# Patient Record
Sex: Female | Born: 1990 | Hispanic: No | Marital: Married | Smoking: Never smoker
Health system: Southern US, Community
[De-identification: ages and names within clinical notes are randomized; demographics above are authoritative.]

## PROBLEM LIST (undated history)

## (undated) ENCOUNTER — Inpatient Hospital Stay (HOSPITAL_COMMUNITY): Payer: Self-pay

## (undated) DIAGNOSIS — Z789 Other specified health status: Secondary | ICD-10-CM

## (undated) DIAGNOSIS — J45909 Unspecified asthma, uncomplicated: Secondary | ICD-10-CM

## (undated) HISTORY — DX: Unspecified asthma, uncomplicated: J45.909

## (undated) HISTORY — PX: WISDOM TOOTH EXTRACTION: SHX21

## (undated) HISTORY — PX: NO PAST SURGERIES: SHX2092

---
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
## 2009-02-23 IMAGING — US US ABDOMEN COMPLETE
2 series · 14 of 25 positions shown · non-contrast
Comparison: None

CLINICAL DATA: Right upper quadrant pain.

ABDOMEN ULTRASOUND
TECHNIQUE: Complete abdominal ultrasound examination was performed
including evaluation of the liver, gallbladder, bile ducts,
pancreas, kidneys, spleen, IVC, and abdominal aorta.

[Series 1: unknown · 0.22mm/px · 2 of 13 slices shown (1 of 2)]
[im 1/13]
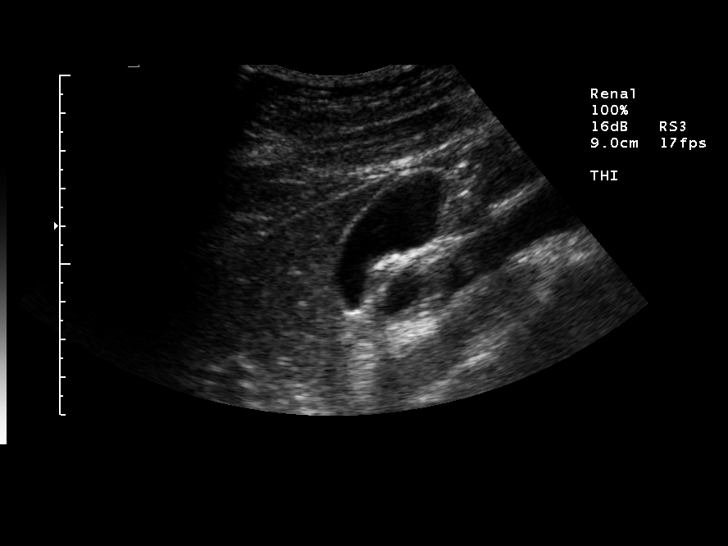
[im 9/13]
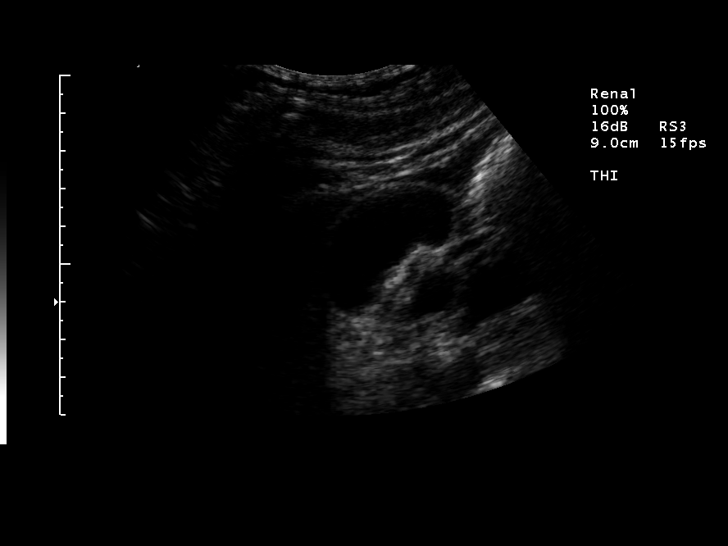

[Series 1: unknown · 0.30mm/px · 12 of 64 slices shown (2 of 2)]
[im 1/64]
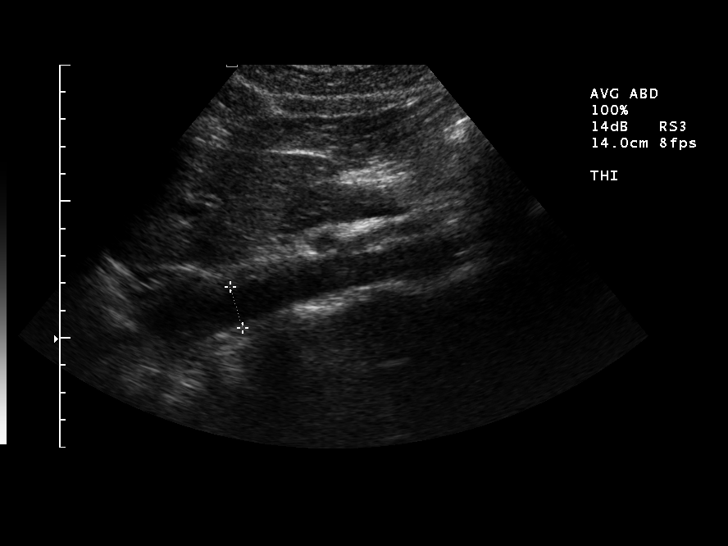
[im 7/64]
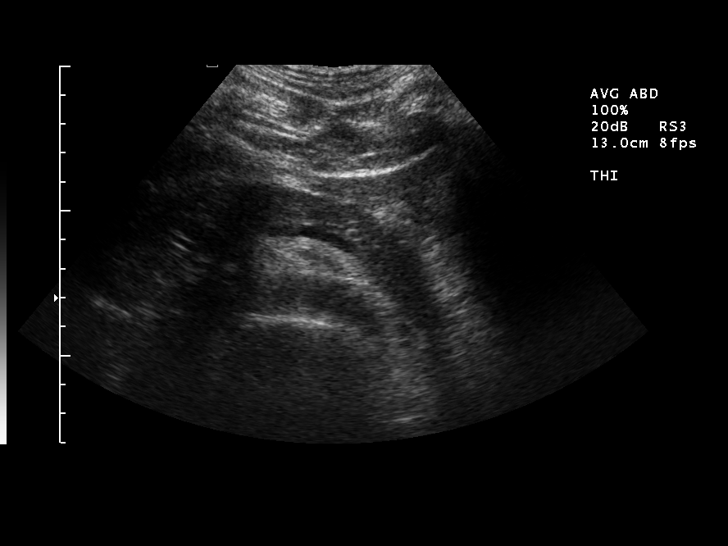
[im 13/64]
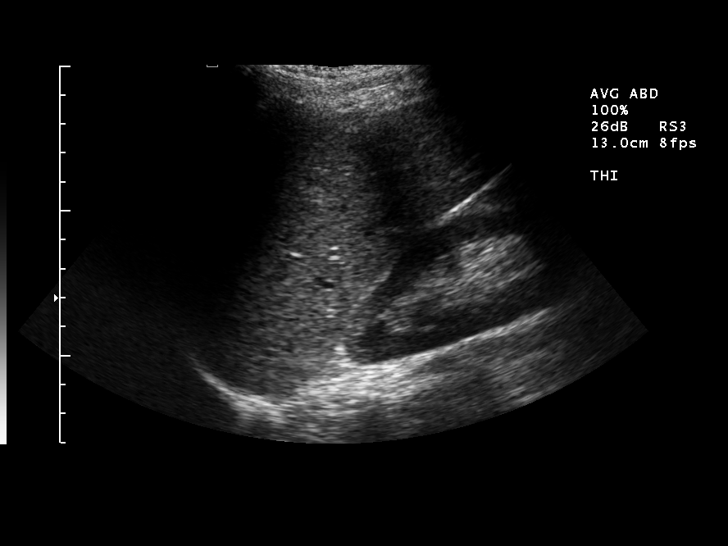
[im 16/64]
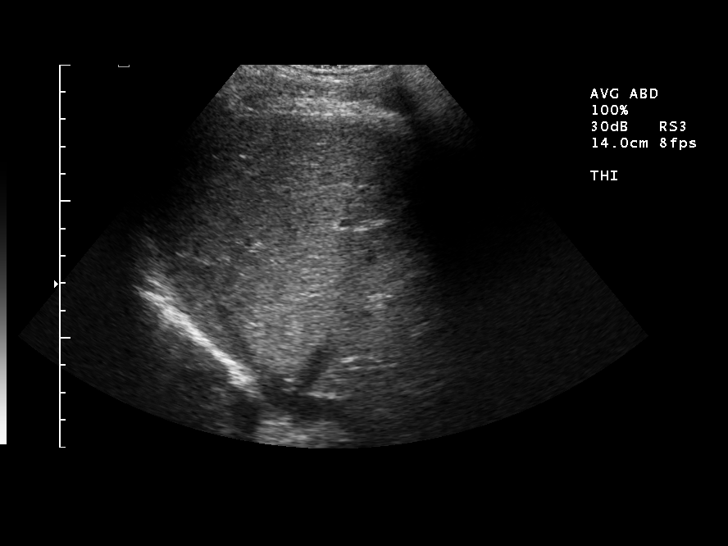
[im 23/64]
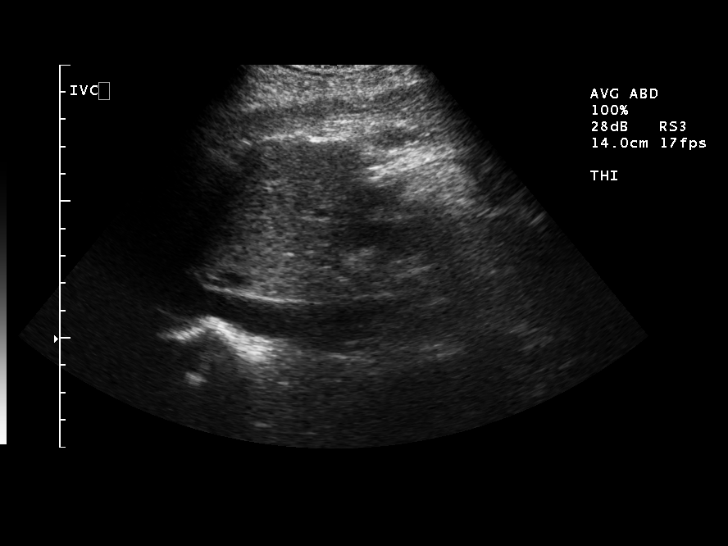
[im 29/64]
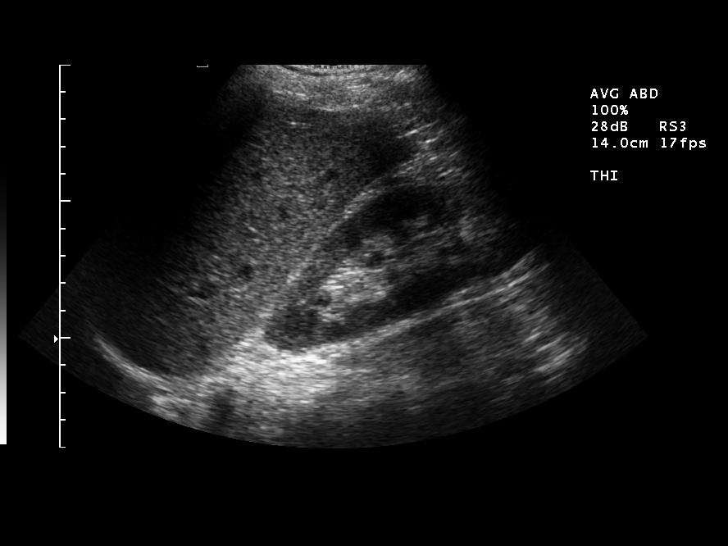
[im 35/64]
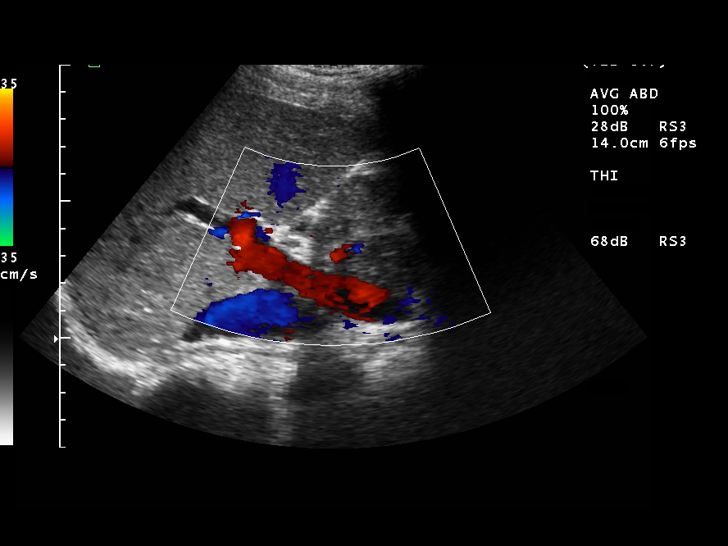
[im 38/64]
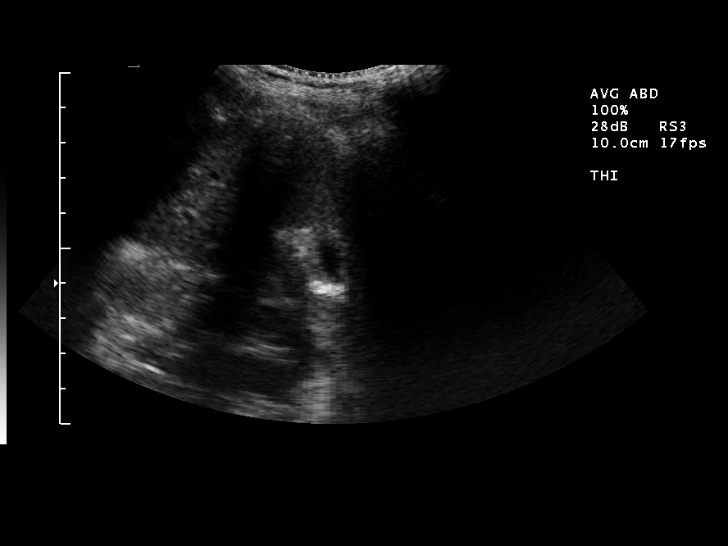
[im 45/64]
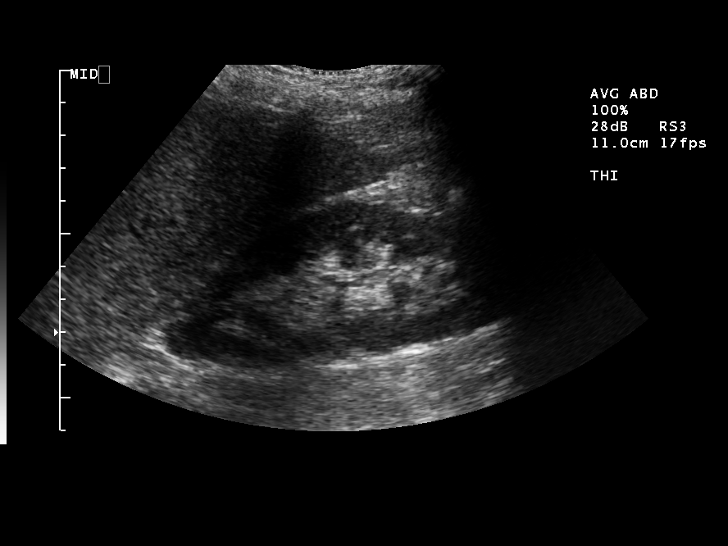
[im 51/64]
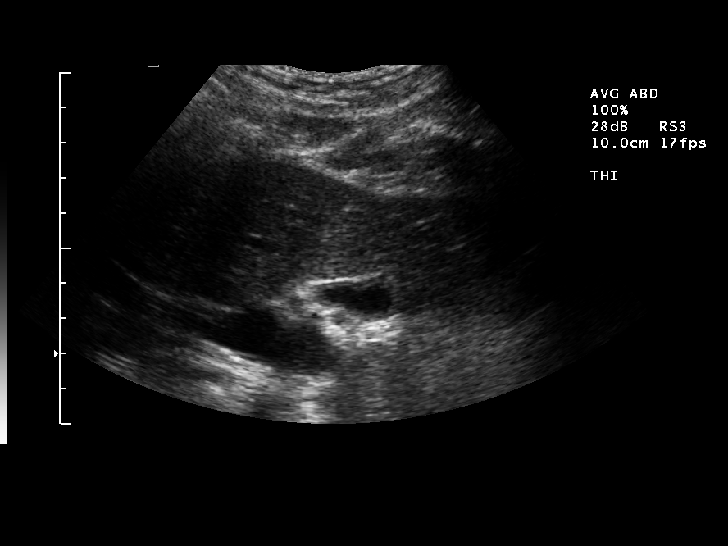
[im 57/64]
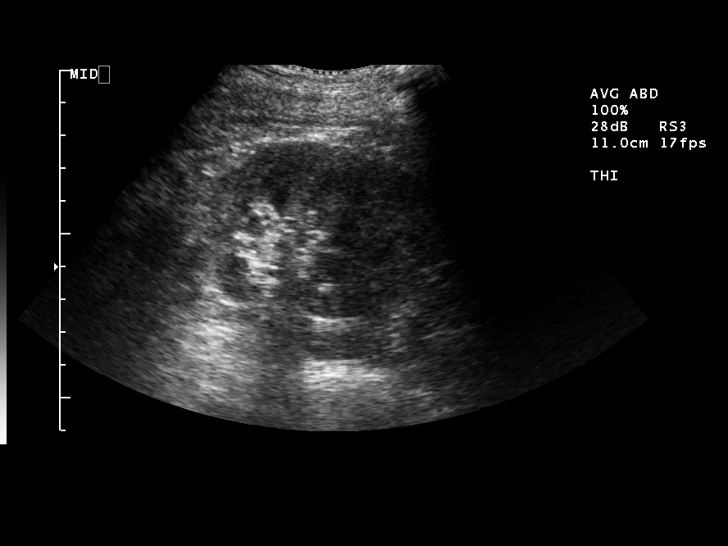
[im 64/64]
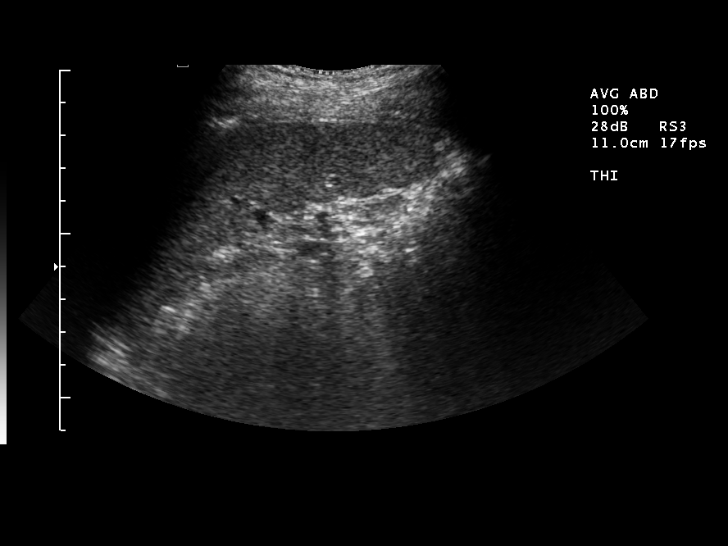

[14 of 25 positions shown; findings below may reference images not displayed]

FINDINGS: Liver, gallbladder, extrahepatic bile duct, IVC,
pancreas, spleen, kidneys and aorta are unremarkable.
IMPRESSION: No acute findings.

## 2009-06-16 ENCOUNTER — Other Ambulatory Visit: Admission: RE | Admit: 2009-06-16 | Discharge: 2009-06-16 | Payer: Self-pay | Admitting: Obstetrics and Gynecology

## 2010-12-18 LAB — DIFFERENTIAL
Basophils Absolute: 0 10*3/uL (ref 0.0–0.1)
Eosinophils Relative: 1 % (ref 0–5)
Lymphocytes Relative: 46 % (ref 12–46)
Lymphs Abs: 2.9 10*3/uL (ref 0.7–4.0)
Monocytes Absolute: 0.5 10*3/uL (ref 0.1–1.0)
Neutro Abs: 2.8 10*3/uL (ref 1.7–7.7)
Neutrophils Relative %: 44 % (ref 43–77)

## 2010-12-18 LAB — CBC
HCT: 40 % (ref 36.0–46.0)
MCHC: 33.6 g/dL (ref 30.0–36.0)
Platelets: 259 10*3/uL (ref 150–400)
WBC: 6.3 10*3/uL (ref 4.0–10.5)

## 2015-12-02 LAB — OB RESULTS CONSOLE ANTIBODY SCREEN: ANTIBODY SCREEN: NEGATIVE

## 2015-12-02 LAB — OB RESULTS CONSOLE ABO/RH: RH TYPE: POSITIVE

## 2015-12-02 LAB — OB RESULTS CONSOLE HIV ANTIBODY (ROUTINE TESTING): HIV: NONREACTIVE

## 2015-12-02 LAB — OB RESULTS CONSOLE HEPATITIS B SURFACE ANTIGEN: HEP B S AG: NEGATIVE

## 2015-12-02 LAB — OB RESULTS CONSOLE RUBELLA ANTIBODY, IGM: RUBELLA: IMMUNE

## 2016-03-15 NOTE — L&D Delivery Note (Addendum)
Delivery Note At 8:03 AM a viable female was delivered via Vaginal, Spontaneous Delivery (Presentation: ROA ;  ).  APGAR: 9, 9; weight  .   Placenta status: L&D, .  Cord: 3VC  with the following complications: none.  Cord pH: sent  Anesthesia:  CLE Episiotomy:  None Lacerations:  2nd degree perineal, L peri-urethral Suture Repair: 2.0 3.0 vicryl Est. Blood Loss (mL):  200cc  Mom to postpartum.  Baby to Couplet care / Skin to Skin. It's a boy - "Stacey Ryan"!  [name] 07/07/2016, 8:45 AM

## 2016-06-02 ENCOUNTER — Inpatient Hospital Stay (HOSPITAL_COMMUNITY)
Admission: AD | Admit: 2016-06-02 | Discharge: 2016-06-02 | Disposition: A | Discharge status: Home / Self Care | Payer: BLUE CROSS/BLUE SHIELD | Source: Ambulatory Visit | Attending: Obstetrics and Gynecology | Admitting: Obstetrics and Gynecology

## 2016-06-02 ENCOUNTER — Encounter (HOSPITAL_COMMUNITY): Payer: Self-pay | Admitting: *Deleted

## 2016-06-02 DIAGNOSIS — N898 Other specified noninflammatory disorders of vagina: Secondary | ICD-10-CM | POA: Diagnosis not present

## 2016-06-02 DIAGNOSIS — Z3A34 34 weeks gestation of pregnancy: Secondary | ICD-10-CM | POA: Diagnosis not present

## 2016-06-02 DIAGNOSIS — Z0371 Encounter for suspected problem with amniotic cavity and membrane ruled out: Secondary | ICD-10-CM

## 2016-06-02 DIAGNOSIS — Z79899 Other long term (current) drug therapy: Secondary | ICD-10-CM | POA: Insufficient documentation

## 2016-06-02 DIAGNOSIS — R102 Pelvic and perineal pain: Secondary | ICD-10-CM | POA: Insufficient documentation

## 2016-06-02 DIAGNOSIS — O9989 Other specified diseases and conditions complicating pregnancy, childbirth and the puerperium: Secondary | ICD-10-CM

## 2016-06-02 DIAGNOSIS — O26893 Other specified pregnancy related conditions, third trimester: Secondary | ICD-10-CM | POA: Diagnosis not present

## 2016-06-02 HISTORY — DX: Other specified health status: Z78.9

## 2016-06-02 LAB — URINALYSIS, ROUTINE W REFLEX MICROSCOPIC
Bilirubin Urine: NEGATIVE
Glucose, UA: NEGATIVE mg/dL
Hgb urine dipstick: NEGATIVE
Ketones, ur: NEGATIVE mg/dL
LEUKOCYTES UA: NEGATIVE
NITRITE: NEGATIVE
PH: 6 (ref 5.0–8.0)
Protein, ur: NEGATIVE mg/dL
SPECIFIC GRAVITY, URINE: 1.011 (ref 1.005–1.030)

## 2016-06-02 LAB — AMNISURE RUPTURE OF MEMBRANE (ROM) NOT AT ARMC: AMNISURE: NEGATIVE

## 2016-06-02 NOTE — MAU Provider Note (Signed)
History     CSN: [id number]  Arrival date and time: 06/02/16 1818   First Provider Initiated Contact with Patient 06/02/16 1927      Chief Complaint  Patient presents with  . Rupture of Membranes  . Pelvic Pain   HPI   [name] is a 26 y.o. female G1P0 @ 6557w4d here in MAU with leaking of fluid. The leaking started 3 days ago. The fluid is clear. She was advised by her Dr. To come into MAU.   OB History    Gravida Para Term Preterm AB Living   1             SAB TAB Ectopic Multiple Live Births                  Past Medical History:  Diagnosis Date  . Medical history non-contributory     Past Surgical History:  Procedure Laterality Date  . WISDOM TOOTH EXTRACTION      No family history on file.  Social History  Substance Use Topics  . Smoking status: Never Smoker  . Smokeless tobacco: Never Used  . Alcohol use No    Allergies: No Known Allergies  Prescriptions Prior to Admission  Medication Sig Dispense Refill Last Dose  . albuterol (PROVENTIL HFA;VENTOLIN HFA) 108 (90 Base) MCG/ACT inhaler Inhale 2 puffs into the lungs every 6 (six) hours as needed.    Rescue  . ferrous sulfate 325 (65 FE) MG tablet Take 325 mg by mouth daily with breakfast.   06/02/2016 at Unknown time  . omeprazole (PRILOSEC) 20 MG capsule Take 20 mg by mouth daily.   Past Week at Unknown time  . Prenatal Vit-Fe Fumarate-FA (PRENATAL MULTIVITAMIN) TABS tablet Take 1 tablet by mouth daily at 12 noon.   06/02/2016 at Unknown time   Results for orders placed or performed during the hospital encounter of 06/02/16 (from the past 48 hour(s))  Urinalysis, Routine w reflex microscopic     Status: None   Collection Time: 06/02/16  6:42 PM  Result Value Ref Range   Color, Urine YELLOW YELLOW   APPearance CLEAR CLEAR   Specific Gravity, Urine 1.011 1.005 - 1.030   pH 6.0 5.0 - 8.0   Glucose, UA NEGATIVE NEGATIVE mg/dL   Hgb urine dipstick NEGATIVE NEGATIVE   Bilirubin Urine NEGATIVE  NEGATIVE   Ketones, ur NEGATIVE NEGATIVE mg/dL   Protein, ur NEGATIVE NEGATIVE mg/dL   Nitrite NEGATIVE NEGATIVE   Leukocytes, UA NEGATIVE NEGATIVE  [facility]     Status: None   Collection Time: 06/02/16  7:40 PM  Result Value Ref Range   Amnisure ROM NEGATIVE    Review of Systems  Constitutional: Negative for fever.  Genitourinary: Positive for pelvic pain and vaginal discharge.   Physical Exam   Blood pressure 120/74, pulse 88, temperature 98.2 F (36.8 C), temperature source Oral, resp. rate 16.  Physical Exam  Constitutional: She is oriented to person, place, and time. She appears well-developed and well-nourished. No distress.  HENT:  Head: Normocephalic.  Eyes: Pupils are equal, round, and reactive to light.  Respiratory: Effort normal.  GI: Soft. She exhibits no distension. There is no tenderness. There is no rebound and no guarding.  Genitourinary:  Genitourinary Comments: Vagina - Small amount of white, mucus like vaginal discharge, no odor, no pooling of fluid  Cervix - appears closed, no contact bleeding, no active bleeding  Bimanual exam: Cervix : closed, thick, posterior  Amnisure collected  Chaperone present for exam.   Musculoskeletal: Normal range of motion.  Neurological: She is alert and oriented to person, place, and time.  Skin: Skin is warm. She is not diaphoretic.  Psychiatric: Her behavior is normal.   Fetal Tracing: Baseline: 145 bpm Variability: Moderate  Accelerations: 15x15 Decelerations: None Toco: occasional UI   MAU Course  Procedures  None  MDM  Fern slide negative amnisure negative  Discussed patient with Dr. Rana Snare ok to DC home.   Assessment and Plan   A:  1. Encounter for suspected premature rupture of amniotic membranes, with rupture of membranes not found   2. Pelvic pain affecting pregnancy in third trimester, antepartum     P:  Discharge home in stable condition Return to MAU If  symptoms worsen Preterm labor precautions Follow up with Dr. Lianne Moris I Rasch, NP 06/02/2016 8:24 PM

## 2016-06-02 NOTE — Discharge Instructions (Signed)
Third Trimester of Pregnancy The third trimester is from week 28 through week 40 (months 7 through 9). The third trimester is a time when the unborn baby (fetus) is growing rapidly. At the end of the ninth month, the fetus is about 20 inches in length and weighs 6-10 pounds. Body changes during your third trimester Your body will continue to go through many changes during pregnancy. The changes vary from woman to woman. During the third trimester:  Your weight will continue to increase. You can expect to gain 25-35 pounds (11-16 kg) by the end of the pregnancy.  You may begin to get stretch marks on your hips, abdomen, and breasts.  You may urinate more often because the fetus is moving lower into your pelvis and pressing on your bladder.  You may develop or continue to have heartburn. This is caused by increased hormones that slow down muscles in the digestive tract.  You may develop or continue to have constipation because increased hormones slow digestion and cause the muscles that push waste through your intestines to relax.  You may develop hemorrhoids. These are swollen veins (varicose veins) in the rectum that can itch or be painful.  You may develop swollen, bulging veins (varicose veins) in your legs.  You may have increased body aches in the pelvis, back, or thighs. This is due to weight gain and increased hormones that are relaxing your joints.  You may have changes in your hair. These can include thickening of your hair, rapid growth, and changes in texture. Some women also have hair loss during or after pregnancy, or hair that feels dry or thin. Your hair will most likely return to normal after your baby is born.  Your breasts will continue to grow and they will continue to become tender. A yellow fluid (colostrum) may leak from your breasts. This is the first milk you are producing for your baby.  Your belly button may stick out.  You may notice more swelling in your hands,  face, or ankles.  You may have increased tingling or numbness in your hands, arms, and legs. The skin on your belly may also feel numb.  You may feel short of breath because of your expanding uterus.  You may have more problems sleeping. This can be caused by the size of your belly, increased need to urinate, and an increase in your body's metabolism.  You may notice the fetus "dropping," or moving lower in your abdomen (lightening).  You may have increased vaginal discharge.  You may notice your joints feel loose and you may have pain around your pelvic bone.  What to expect at prenatal visits You will have prenatal exams every 2 weeks until week 36. Then you will have weekly prenatal exams. During a routine prenatal visit:  You will be weighed to make sure you and the baby are growing normally.  Your blood pressure will be taken.  Your abdomen will be measured to track your baby's growth.  The fetal heartbeat will be listened to.  Any test results from the previous visit will be discussed.  You may have a cervical check near your due date to see if your cervix has softened or thinned (effaced).  You will be tested for Group B streptococcus. This happens between 35 and 37 weeks.  Your health care provider may ask you:  What your birth plan is.  How you are feeling.  If you are feeling the baby move.  If you have had   any abnormal symptoms, such as leaking fluid, bleeding, severe headaches, or abdominal cramping.  If you are using any tobacco products, including cigarettes, chewing tobacco, and electronic cigarettes.  If you have any questions.  Other tests or screenings that may be performed during your third trimester include:  Blood tests that check for low iron levels (anemia).  Fetal testing to check the health, activity level, and growth of the fetus. Testing is done if you have certain medical conditions or if there are problems during the  pregnancy.  Nonstress test (NST). This test checks the health of your baby to make sure there are no signs of problems, such as the baby not getting enough oxygen. During this test, a belt is placed around your belly. The baby is made to move, and its heart rate is monitored during movement.  What is false labor? False labor is a condition in which you feel small, irregular tightenings of the muscles in the womb (contractions) that usually go away with rest, changing position, or drinking water. These are called Braxton Hicks contractions. Contractions may last for hours, days, or even weeks before true labor sets in. If contractions come at regular intervals, become more frequent, increase in intensity, or become painful, you should see your health care provider. What are the signs of labor?  Abdominal cramps.  Regular contractions that start at 10 minutes apart and become stronger and more frequent with time.  Contractions that start on the top of the uterus and spread down to the lower abdomen and back.  Increased pelvic pressure and dull back pain.  A watery or bloody mucus discharge that comes from the vagina.  Leaking of amniotic fluid. This is also known as your "water breaking." It could be a slow trickle or a gush. Let your health care provider know if it has a color or strange odor. If you have any of these signs, call your health care provider right away, even if it is before your due date. Follow these instructions at home: Medicines  Follow your health care provider's instructions regarding medicine use. Specific medicines may be either safe or unsafe to take during pregnancy.  Take a prenatal vitamin that contains at least 600 micrograms (mcg) of folic acid.  If you develop constipation, try taking a stool softener if your health care provider approves. Eating and drinking  Eat a balanced diet that includes fresh fruits and vegetables, whole grains, good sources of protein  such as meat, eggs, or tofu, and low-fat dairy. Your health care provider will help you determine the amount of weight gain that is right for you.  Avoid raw meat and uncooked cheese. These carry germs that can cause birth defects in the baby.  If you have low calcium intake from food, talk to your health care provider about whether you should take a daily calcium supplement.  Eat four or five small meals rather than three large meals a day.  Limit foods that are high in fat and processed sugars, such as fried and sweet foods.  To prevent constipation: ? Drink enough fluid to keep your urine clear or pale yellow. ? Eat foods that are high in fiber, such as fresh fruits and vegetables, whole grains, and beans. Activity  Exercise only as directed by your health care provider. Most women can continue their usual exercise routine during pregnancy. Try to exercise for 30 minutes at least 5 days a week. Stop exercising if you experience uterine contractions.  Avoid heavy   lifting.  Do not exercise in extreme heat or humidity, or at high altitudes.  Wear low-heel, comfortable shoes.  Practice good posture.  You may continue to have sex unless your health care provider tells you otherwise. Relieving pain and discomfort  Take frequent breaks and rest with your legs elevated if you have leg cramps or low back pain.  Take warm sitz baths to soothe any pain or discomfort caused by hemorrhoids. Use hemorrhoid cream if your health care provider approves.  Wear a good support bra to prevent discomfort from breast tenderness.  If you develop varicose veins: ? Wear support pantyhose or compression stockings as told by your healthcare provider. ? Elevate your feet for 15 minutes, 3-4 times a day. Prenatal care  Write down your questions. Take them to your prenatal visits.  Keep all your prenatal visits as told by your health care provider. This is important. Safety  Wear your seat belt at  all times when driving.  Make a list of emergency phone numbers, including numbers for family, friends, the hospital, and police and fire departments. General instructions  Avoid cat litter boxes and soil used by cats. These carry germs that can cause birth defects in the baby. If you have a cat, ask someone to clean the litter box for you.  Do not travel far distances unless it is absolutely necessary and only with the approval of your health care provider.  Do not use hot tubs, steam rooms, or saunas.  Do not drink alcohol.  Do not use any products that contain nicotine or tobacco, such as cigarettes and e-cigarettes. If you need help quitting, ask your health care provider.  Do not use any medicinal herbs or unprescribed drugs. These chemicals affect the formation and growth of the baby.  Do not douche or use tampons or scented sanitary pads.  Do not cross your legs for long periods of time.  To prepare for the arrival of your baby: ? Take prenatal classes to understand, practice, and ask questions about labor and delivery. ? Make a trial run to the hospital. ? Visit the hospital and tour the maternity area. ? Arrange for maternity or paternity leave through employers. ? Arrange for family and friends to take care of pets while you are in the hospital. ? Purchase a rear-facing car seat and make sure you know how to install it in your car. ? Pack your hospital bag. ? Prepare the baby's nursery. Make sure to remove all pillows and stuffed animals from the baby's crib to prevent suffocation.  Visit your dentist if you have not gone during your pregnancy. Use a soft toothbrush to brush your teeth and be gentle when you floss. Contact a health care provider if:  You are unsure if you are in labor or if your water has broken.  You become dizzy.  You have mild pelvic cramps, pelvic pressure, or nagging pain in your abdominal area.  You have lower back pain.  You have persistent  nausea, vomiting, or diarrhea.  You have an unusual or bad smelling vaginal discharge.  You have pain when you urinate. Get help right away if:  Your water breaks before 37 weeks.  You have regular contractions less than 5 minutes apart before 37 weeks.  You have a fever.  You are leaking fluid from your vagina.  You have spotting or bleeding from your vagina.  You have severe abdominal pain or cramping.  You have rapid weight loss or weight gain.    You have shortness of breath with chest pain.  You notice sudden or extreme swelling of your face, hands, ankles, feet, or legs.  Your baby makes fewer than 10 movements in 2 hours.  You have severe headaches that do not go away when you take medicine.  You have vision changes. Summary  The third trimester is from week 28 through week 40, months 7 through 9. The third trimester is a time when the unborn baby (fetus) is growing rapidly.  During the third trimester, your discomfort may increase as you and your baby continue to gain weight. You may have abdominal, leg, and back pain, sleeping problems, and an increased need to urinate.  During the third trimester your breasts will keep growing and they will continue to become tender. A yellow fluid (colostrum) may leak from your breasts. This is the first milk you are producing for your baby.  False labor is a condition in which you feel small, irregular tightenings of the muscles in the womb (contractions) that eventually go away. These are called Braxton Hicks contractions. Contractions may last for hours, days, or even weeks before true labor sets in.  Signs of labor can include: abdominal cramps; regular contractions that start at 10 minutes apart and become stronger and more frequent with time; watery or bloody mucus discharge that comes from the vagina; increased pelvic pressure and dull back pain; and leaking of amniotic fluid. This information is not intended to replace advice  given to you by your health care provider. Make sure you discuss any questions you have with your health care provider. Document Released: 02/23/2001 Document Revised: 08/07/2015 Document Reviewed: 05/02/2012 Elsevier Interactive Patient Education  2017 [employer]  

## 2016-06-02 NOTE — Progress Notes (Addendum)
Assumed care of pt.   1938: provider at bs. Speculum exam, Amniosure, and pelvic exam done. SVE closed thick high.   1944: Pharmacy tech at bs.   2026: discharge instructions given with pt understanding. Pt left unit via ambulatory with her SO. Copy of discharge instructions provided but pt opted to have it shredded.

## 2016-06-02 NOTE — MAU Note (Signed)
Pt C/O ? Leaking of fluid for the past 3 days, underwear has been saturated with clear liquid.  Has had ongoing pelvic pain since last week.  Denies bleeding.  Called MD office, was advised to come in.

## 2016-07-01 ENCOUNTER — Encounter (HOSPITAL_COMMUNITY): Payer: Self-pay | Admitting: *Deleted

## 2016-07-01 ENCOUNTER — Telehealth (HOSPITAL_COMMUNITY): Payer: Self-pay | Admitting: *Deleted

## 2016-07-01 LAB — OB RESULTS CONSOLE GBS: GBS: NEGATIVE

## 2016-07-01 NOTE — Telephone Encounter (Signed)
Preadmission screen  

## 2016-07-02 NOTE — H&P (Signed)
Stacey Ryan is a 26 y.o. female presenting for IOL. 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 OB History    Gravida Para Term Preterm AB Living   1             SAB TAB Ectopic Multiple Live Births                 Past Medical History:  Diagnosis Date  . Asthma   . Medical history non-contributory    Past Surgical History:  Procedure Laterality Date  . WISDOM TOOTH EXTRACTION     Family History: family history includes Crohn's disease in her maternal grandmother; Diabetes in her maternal grandmother; Early death in her mother; Hypertension in her father; Kidney disease in her maternal grandmother. Social History:  reports that she has never smoked. She has never used smokeless tobacco. She reports that she does not drink alcohol or use drugs.     Maternal Diabetes: No Genetic Screening: Normal Maternal Ultrasounds/Referrals: Normal Fetal Ultrasounds or other Referrals:  None Maternal Substance Abuse:  No Significant Maternal Medications:  None Significant Maternal Lab Results:  None Other Comments:  None  ROS History   There were no vitals taken for this visit. Exam Physical Exam  (from clinic) NAD, A&O NWOB Abd soft, nondistended, gravid SVE 3.5/50/-1, soft, anterior No lesions on exam  Prenatal labs: ABO, Rh: A/Positive/-- (09/19 0000) Antibody: Negative (09/19 0000) Rubella: Immune (09/19 0000) RPR:    HBsAg: Negative (09/19 0000)  HIV: Non-reactive (09/19 0000)  GBS: Negative (04/19 0000)   Assessment/Plan: 26yo G1P0 .4wga presenting for IOL w/favorable cervix.   # IOL:  - cytotec x 1 followed by pitocin 4 hours after first dose.  - AROM after starting pitocin  # MWB:  - h/o HSV, has been on proph valtrex. No lesions on exam - h/o Asthma, no meds, well-controlled  # FWB:  - EFW 7#10  # GBS neg   Stacey Ryan 07/02/2016, 1:03 PM

## 2016-07-06 ENCOUNTER — Encounter (HOSPITAL_COMMUNITY): Payer: Self-pay | Admitting: Certified Nurse Midwife

## 2016-07-06 ENCOUNTER — Inpatient Hospital Stay (HOSPITAL_COMMUNITY)
Admission: AD | Admit: 2016-07-06 | Discharge: 2016-07-08 | DRG: 774 | Disposition: A | Discharge status: Home / Self Care | Payer: BLUE CROSS/BLUE SHIELD | Source: Ambulatory Visit | Attending: Obstetrics and Gynecology | Admitting: Obstetrics and Gynecology

## 2016-07-06 DIAGNOSIS — Z3490 Encounter for supervision of normal pregnancy, unspecified, unspecified trimester: Secondary | ICD-10-CM

## 2016-07-06 DIAGNOSIS — O9832 Other infections with a predominantly sexual mode of transmission complicating childbirth: Secondary | ICD-10-CM | POA: Diagnosis present

## 2016-07-06 DIAGNOSIS — A6000 Herpesviral infection of urogenital system, unspecified: Secondary | ICD-10-CM | POA: Diagnosis present

## 2016-07-06 DIAGNOSIS — Z3A39 39 weeks gestation of pregnancy: Secondary | ICD-10-CM

## 2016-07-06 DIAGNOSIS — Z3493 Encounter for supervision of normal pregnancy, unspecified, third trimester: Secondary | ICD-10-CM | POA: Diagnosis present

## 2016-07-06 LAB — COMPREHENSIVE METABOLIC PANEL
ALT: 12 U/L — ABNORMAL LOW (ref 14–54)
ANION GAP: 10 (ref 5–15)
AST: 23 U/L (ref 15–41)
Albumin: 3 g/dL — ABNORMAL LOW (ref 3.5–5.0)
Alkaline Phosphatase: 160 U/L — ABNORMAL HIGH (ref 38–126)
BUN: 8 mg/dL (ref 6–20)
CHLORIDE: 104 mmol/L (ref 101–111)
CO2: 20 mmol/L — AB (ref 22–32)
Calcium: 9.2 mg/dL (ref 8.9–10.3)
Creatinine, Ser: 0.58 mg/dL (ref 0.44–1.00)
GFR calc Af Amer: >60 mL/min (ref >60)
GFR calc non Af Amer: >60 mL/min (ref >60)
Glucose, Bld: 86 mg/dL (ref 65–99)
Potassium: 4.1 mmol/L (ref 3.5–5.1)
SODIUM: 134 mmol/L — AB (ref 135–145)
Total Bilirubin: 0.3 mg/dL (ref 0.3–1.2)
Total Protein: 6.4 g/dL — ABNORMAL LOW (ref 6.5–8.1)

## 2016-07-06 LAB — CBC
HEMATOCRIT: 36.5 % (ref 36.0–46.0)
HEMOGLOBIN: 12.5 g/dL (ref 12.0–15.0)
MCH: 30 pg (ref 26.0–34.0)
MCHC: 34.2 g/dL (ref 30.0–36.0)
MCV: 87.7 fL (ref 78.0–100.0)
Platelets: 241 10*3/uL (ref 150–400)
RBC: 4.16 MIL/uL (ref 3.87–5.11)
RDW: 14.1 % (ref 11.5–15.5)
WBC: 11.8 10*3/uL — AB (ref 4.0–10.5)

## 2016-07-06 MED ORDER — ONDANSETRON HCL 4 MG/2ML IJ SOLN
4.0000 mg | Freq: Four times a day (QID) | INTRAMUSCULAR | Status: DC | PRN
Start: 2016-07-06 — End: 2016-07-07

## 2016-07-06 MED ORDER — LACTATED RINGERS IV SOLN: 500.0000 mL | INTRAVENOUS | Status: DC | PRN

## 2016-07-06 MED ORDER — OXYTOCIN 40 UNITS IN LACTATED RINGERS INFUSION - SIMPLE MED
2.5000 [IU]/h | INTRAVENOUS | Status: DC
  Filled 2016-07-06: qty 1000

## 2016-07-06 MED ORDER — LIDOCAINE HCL (PF) 1 % IJ SOLN
30.0000 mL | INTRAMUSCULAR | Status: DC | PRN
  Filled 2016-07-06: qty 30

## 2016-07-06 MED ORDER — SOD CITRATE-CITRIC ACID 500-334 MG/5ML PO SOLN
30.0000 mL | ORAL | Status: DC | PRN
Start: 2016-07-06 — End: 2016-07-07
  Administered 2016-07-07: 30 mL via ORAL
  Filled 2016-07-06: qty 15

## 2016-07-06 MED ORDER — FLEET ENEMA 7-19 GM/118ML RE ENEM
1.0000 | ENEMA | RECTAL | Status: DC | PRN
Start: 2016-07-06 — End: 2016-07-07

## 2016-07-06 MED ORDER — OXYCODONE-ACETAMINOPHEN 5-325 MG PO TABS: 2.0000 | ORAL_TABLET | ORAL | Status: DC | PRN

## 2016-07-06 MED ORDER — LACTATED RINGERS IV SOLN
INTRAVENOUS | Status: DC
Start: 2016-07-06 — End: 2016-07-07
  Administered 2016-07-07: 06:00:00 via INTRAVENOUS

## 2016-07-06 MED ORDER — OXYCODONE-ACETAMINOPHEN 5-325 MG PO TABS: 1.0000 | ORAL_TABLET | ORAL | Status: DC | PRN

## 2016-07-06 MED ORDER — ACETAMINOPHEN 325 MG PO TABS: 650.0000 mg | ORAL_TABLET | ORAL | Status: DC | PRN

## 2016-07-06 MED ORDER — OXYTOCIN BOLUS FROM INFUSION
500.0000 mL | Freq: Once | INTRAVENOUS | Status: AC
  Administered 2016-07-07: 500 mL via INTRAVENOUS

## 2016-07-06 NOTE — MAU Note (Signed)
Pt here with c/o contractions since 1830. Scheduled for a midnight induction. Was 4.5cm last time she was checked.

## 2016-07-07 ENCOUNTER — Encounter (HOSPITAL_COMMUNITY): Payer: Self-pay | Admitting: *Deleted

## 2016-07-07 ENCOUNTER — Inpatient Hospital Stay (HOSPITAL_COMMUNITY): Payer: BLUE CROSS/BLUE SHIELD | Admitting: Anesthesiology

## 2016-07-07 ENCOUNTER — Inpatient Hospital Stay (HOSPITAL_COMMUNITY)
Admission: RE | Admit: 2016-07-07 | Discharge: 2016-07-07 | Disposition: A | Discharge status: Home / Self Care | Payer: BLUE CROSS/BLUE SHIELD | Source: Ambulatory Visit | Attending: Obstetrics and Gynecology | Admitting: Obstetrics and Gynecology

## 2016-07-07 LAB — RPR: RPR Ser Ql: NONREACTIVE

## 2016-07-07 LAB — TYPE AND SCREEN
ABO/RH(D): A POS
Antibody Screen: NEGATIVE

## 2016-07-07 LAB — ABO/RH: ABO/RH(D): A POS

## 2016-07-07 MED ORDER — PHENYLEPHRINE 40 MCG/ML (10ML) SYRINGE FOR IV PUSH (FOR BLOOD PRESSURE SUPPORT)
80.0000 ug | PREFILLED_SYRINGE | INTRAVENOUS | Status: DC | PRN
  Filled 2016-07-07: qty 5

## 2016-07-07 MED ORDER — SENNOSIDES-DOCUSATE SODIUM 8.6-50 MG PO TABS
2.0000 | ORAL_TABLET | ORAL | Status: DC
  Administered 2016-07-07: 2 via ORAL
  Filled 2016-07-07: qty 2

## 2016-07-07 MED ORDER — BENZOCAINE-MENTHOL 20-0.5 % EX AERO
1.0000 "application " | INHALATION_SPRAY | CUTANEOUS | Status: DC | PRN
  Administered 2016-07-07: 1 via TOPICAL
  Filled 2016-07-07: qty 56

## 2016-07-07 MED ORDER — EPHEDRINE 5 MG/ML INJ
10.0000 mg | INTRAVENOUS | Status: DC | PRN
  Filled 2016-07-07: qty 2

## 2016-07-07 MED ORDER — DIPHENHYDRAMINE HCL 50 MG/ML IJ SOLN: 12.5000 mg | INTRAMUSCULAR | Status: DC | PRN

## 2016-07-07 MED ORDER — OXYCODONE HCL 5 MG PO TABS: 10.0000 mg | ORAL_TABLET | ORAL | Status: DC | PRN

## 2016-07-07 MED ORDER — ZOLPIDEM TARTRATE 5 MG PO TABS: 5.0000 mg | ORAL_TABLET | Freq: Every evening | ORAL | Status: DC | PRN

## 2016-07-07 MED ORDER — DIPHENHYDRAMINE HCL 25 MG PO CAPS: 25.0000 mg | ORAL_CAPSULE | Freq: Four times a day (QID) | ORAL | Status: DC | PRN

## 2016-07-07 MED ORDER — FENTANYL 2.5 MCG/ML BUPIVACAINE 1/10 % EPIDURAL INFUSION (WH - ANES)
INTRAMUSCULAR | Status: AC
  Filled 2016-07-07: qty 100

## 2016-07-07 MED ORDER — SIMETHICONE 80 MG PO CHEW: 80.0000 mg | CHEWABLE_TABLET | ORAL | Status: DC | PRN

## 2016-07-07 MED ORDER — WITCH HAZEL-GLYCERIN EX PADS: 1.0000 "application " | MEDICATED_PAD | CUTANEOUS | Status: DC | PRN

## 2016-07-07 MED ORDER — ACETAMINOPHEN 325 MG PO TABS
650.0000 mg | ORAL_TABLET | ORAL | Status: DC | PRN
  Administered 2016-07-07 – 2016-07-08 (×3): 650 mg via ORAL
  Filled 2016-07-07 (×3): qty 2

## 2016-07-07 MED ORDER — PHENYLEPHRINE 40 MCG/ML (10ML) SYRINGE FOR IV PUSH (FOR BLOOD PRESSURE SUPPORT)
PREFILLED_SYRINGE | INTRAVENOUS | Status: AC
Start: 2016-07-07 — End: 2016-07-07
  Filled 2016-07-07: qty 20

## 2016-07-07 MED ORDER — IBUPROFEN 600 MG PO TABS
600.0000 mg | ORAL_TABLET | Freq: Four times a day (QID) | ORAL | Status: DC
  Administered 2016-07-07 – 2016-07-08 (×5): 600 mg via ORAL
  Filled 2016-07-07 (×5): qty 1

## 2016-07-07 MED ORDER — PRENATAL MULTIVITAMIN CH
1.0000 | ORAL_TABLET | Freq: Every day | ORAL | Status: DC
  Administered 2016-07-07 – 2016-07-08 (×2): 1 via ORAL
  Filled 2016-07-07 (×2): qty 1

## 2016-07-07 MED ORDER — ONDANSETRON HCL 4 MG PO TABS: 4.0000 mg | ORAL_TABLET | ORAL | Status: DC | PRN

## 2016-07-07 MED ORDER — LIDOCAINE HCL (PF) 1 % IJ SOLN
INTRAMUSCULAR | Status: DC | PRN
  Administered 2016-07-07: 13 mL via EPIDURAL

## 2016-07-07 MED ORDER — COCONUT OIL OIL
1.0000 "application " | TOPICAL_OIL | Status: DC | PRN
  Administered 2016-07-07: 1 via TOPICAL
  Filled 2016-07-07: qty 120

## 2016-07-07 MED ORDER — DIBUCAINE 1 % RE OINT: 1.0000 "application " | TOPICAL_OINTMENT | RECTAL | Status: DC | PRN

## 2016-07-07 MED ORDER — LACTATED RINGERS IV SOLN: 500.0000 mL | Freq: Once | INTRAVENOUS | Status: DC

## 2016-07-07 MED ORDER — TETANUS-DIPHTH-ACELL PERTUSSIS 5-2.5-18.5 LF-MCG/0.5 IM SUSP: 0.5000 mL | Freq: Once | INTRAMUSCULAR | Status: DC

## 2016-07-07 MED ORDER — ONDANSETRON HCL 4 MG/2ML IJ SOLN: 4.0000 mg | INTRAMUSCULAR | Status: DC | PRN

## 2016-07-07 MED ORDER — OXYCODONE HCL 5 MG PO TABS
5.0000 mg | ORAL_TABLET | ORAL | Status: DC | PRN
  Administered 2016-07-07: 5 mg via ORAL
  Filled 2016-07-07: qty 1

## 2016-07-07 MED ORDER — FENTANYL 2.5 MCG/ML BUPIVACAINE 1/10 % EPIDURAL INFUSION (WH - ANES)
14.0000 mL/h | INTRAMUSCULAR | Status: DC | PRN
  Administered 2016-07-07: 14 mL/h via EPIDURAL

## 2016-07-07 NOTE — Anesthesia Postprocedure Evaluation (Signed)
Anesthesia Post Note  Patient: Stacey Ryan  Procedure(s) Performed: * No procedures listed *  Patient location during evaluation: Mother Baby Anesthesia Type: Epidural Level of consciousness: awake and alert and oriented Pain management: pain level controlled Vital Signs Assessment: post-procedure vital signs reviewed and stable Respiratory status: nonlabored ventilation and spontaneous breathing Cardiovascular status: stable Postop Assessment: no headache, patient able to bend at knees, no backache, no signs of nausea or vomiting, epidural receding and adequate PO intake Anesthetic complications: no        Last Vitals:  Vitals:   07/07/16 0955 07/07/16 1100  BP: 128/89 120/77  Pulse: 97 95  Resp: 18 18  Temp: 37 C 36.8 C    Last Pain:  Vitals:   07/07/16 1100  TempSrc: Oral  PainSc: 0-No pain   Pain Goal: Patients Stated Pain Goal: 5 (07/07/16 0600)               Kristine Chahal Hristova

## 2016-07-07 NOTE — Progress Notes (Signed)
S: no complaints. Comf w/cle.   O:  Vitals:   07/07/16 0630 07/07/16 0700  BP: 115/63 109/66  Pulse: 88 93  Resp:    Temp:      Gen: NAD SVE: AROM for clear fluid, c/c/+2, OP, rotated to ROA GYN: No lesions  A/P: 26 yo G1P0 @ 39.4wga presenting for IOL, found to be in early labor. Now approaching second stage. No HSV lesions on exam - has been on valtrex since 36 wga. Will start to push. GBS neg. EL

## 2016-07-07 NOTE — Anesthesia Preprocedure Evaluation (Signed)
Anesthesia Evaluation  Patient identified by MRN, date of birth, ID band Patient awake    Reviewed: Allergy & Precautions, NPO status , Patient's Chart, lab work & pertinent test results  Airway Mallampati: II  TM Distance: >3 FB Neck ROM: Full    Dental no notable dental hx.    Pulmonary neg pulmonary ROS,    Pulmonary exam normal breath sounds clear to auscultation       Cardiovascular negative cardio ROS Normal cardiovascular exam Rhythm:Regular Rate:Normal     Neuro/Psych negative neurological ROS  negative psych ROS   GI/Hepatic negative GI ROS, Neg liver ROS,   Endo/Other  negative endocrine ROS  Renal/GU negative Renal ROS  negative genitourinary   Musculoskeletal negative musculoskeletal ROS (+)   Abdominal   Peds negative pediatric ROS (+)  Hematology negative hematology ROS (+)   Anesthesia Other Findings   Reproductive/Obstetrics negative OB ROS (+) Pregnancy                             Anesthesia Physical Anesthesia Plan  ASA: II  Anesthesia Plan: Epidural   Post-op Pain Management:    Induction:   Airway Management Planned:   Additional Equipment:   Intra-op Plan:   Post-operative Plan:   Informed Consent:   Plan Discussed with:   Anesthesia Plan Comments:         Anesthesia Quick Evaluation  

## 2016-07-07 NOTE — Anesthesia Pain Management Evaluation Note (Signed)
  CRNA Pain Management Visit Note  Patient: Stacey Ryan, 26 y.o., female  "Hello I am a member of the anesthesia team at [facility]. We have an anesthesia team available at all times to provide care throughout the hospital, including epidural management and anesthesia for C-section. I don't know your plan for the delivery whether it a natural birth, water birth, IV sedation, nitrous supplementation, doula or epidural, but we want to meet your pain goals."   1.Was your pain managed to your expectations on prior hospitalizations?   No prior hospitalizations  2.What is your expectation for pain management during this hospitalization?     Epidural  3.How can we help you reach that goal? Epidural in place.  Record the patient's initial score and the patient's pain goal.   Pain: 6--I checked the epidural level, and using ice, the level was T8 bilateral. Patient is dilated 10cm at epidural pain consult. I explained that the epidural is not wearing off, but that it is close to time to deliver and that she will likely feel more pressure during this time. Patient comfortable with epidural, despite reported 6/10 pressure-related pain during contractions. I informed patient to tell her L&D RN (who was in the room during the interview) if she has more pressure or pain than she wants to feel, and that anesthesia can return to evaluate. At this time, I did not feel that it was appropriate to administer a more dense local anesthetic or an epidural narcotic. I explained this to the patient as well.  Pain Goal: 10 prior to epidural placement [facility] wants you to be able to say your pain was always managed very well.  Victoriya Pol L 07/07/2016

## 2016-07-07 NOTE — Anesthesia Procedure Notes (Signed)
Epidural Patient location during procedure: OB Start time: 07/07/2016 4:02 AM End time: 07/07/2016 4:18 AM  Staffing Anesthesiologist: Anitra Lauth RAY Performed: anesthesiologist   Preanesthetic Checklist Completed: patient identified, site marked, surgical consent, pre-op evaluation, timeout performed, IV checked, risks and benefits discussed and monitors and equipment checked  Epidural Patient position: sitting Prep: DuraPrep Patient monitoring: heart rate, cardiac monitor, continuous pulse ox and blood pressure Approach: midline Location: L2-L3 Injection technique: LOR saline  Needle:  Needle type: Tuohy  Needle gauge: 17 G Needle length: 9 cm Needle insertion depth: 6 cm Catheter type: closed end flexible Catheter size: 20 Guage Catheter at skin depth: 10 cm Test dose: negative  Assessment Events: blood not aspirated, injection not painful, no injection resistance, negative IV test and no paresthesia  Additional Notes Reason for block:procedure for pain

## 2016-07-08 LAB — CBC
HCT: 26.8 % — ABNORMAL LOW (ref 36.0–46.0)
Hemoglobin: 9.3 g/dL — ABNORMAL LOW (ref 12.0–15.0)
MCH: 30.6 pg (ref 26.0–34.0)
MCHC: 34.7 g/dL (ref 30.0–36.0)
MCV: 88.2 fL (ref 78.0–100.0)
PLATELETS: 188 10*3/uL (ref 150–400)
RBC: 3.04 MIL/uL — ABNORMAL LOW (ref 3.87–5.11)
RDW: 14.5 % (ref 11.5–15.5)
WBC: 9.8 10*3/uL (ref 4.0–10.5)

## 2016-07-08 MED ORDER — IBUPROFEN 600 MG PO TABS
600.0000 mg | ORAL_TABLET | Freq: Four times a day (QID) | ORAL | 1 refills | Status: AC
Start: 2016-07-08 — End: ?

## 2016-07-08 NOTE — Discharge Summary (Signed)
Obstetric Discharge Summary Reason for Admission: induction of labor Prenatal Procedures: ultrasound Intrapartum Procedures: spontaneous vaginal delivery Postpartum Procedures: none Complications-Operative and Postpartum: 2 degree perineal laceration Hemoglobin  Date Value Ref Range Status  07/08/2016 9.3 (L) 12.0 - 15.0 Ryan/dL Final    Comment:    DELTA CHECK NOTED REPEATED TO VERIFY    HCT  Date Value Ref Range Status  07/08/2016 26.8 (L) 36.0 - 46.0 % Final    Physical Exam:  General: alert and cooperative Lochia: appropriate Uterine Fundus: firm Incision: healing well DVT Evaluation: No evidence of DVT seen on physical exam. Negative Homan's sign. No cords or calf tenderness. No significant calf/ankle edema.  Discharge Diagnoses: Term Pregnancy-delivered  Discharge Information: Date: 07/08/2016 Activity: pelvic rest Diet: routine Medications: PNV and Ibuprofen Condition: stable Instructions: refer to practice specific booklet Discharge to: home   Newborn Data: Live born female  Birth Weight: 6 lb 14.4 oz (3130 Ryan) APGAR: 9, 9  Home with mother.  Stacey Ryan 07/08/2016, 8:35 AM

## 2016-07-08 NOTE — Progress Notes (Signed)
Post Partum Day 1 Subjective: no complaints, up ad lib, voiding, tolerating PO, + flatus and desires circ prior to discharge. Would like early discharge  Objective: Blood pressure 123/79, pulse 78, temperature 98.6 F (37 C), resp. rate 18, height  (1.702 m), weight 217 lb (98.4 kg), SpO2 99 %, unknown if currently breastfeeding.  Physical Exam:  General: alert and cooperative Lochia: appropriate Uterine Fundus: firm Incision: healing well DVT Evaluation: No evidence of DVT seen on physical exam. Negative Homan's sign. No cords or calf tenderness. No significant calf/ankle edema.   Recent Labs  07/06/16 2308 07/08/16 0516  HGB 12.5 9.3*  HCT 36.5 26.8*    Assessment/Plan: Discharge home, Breastfeeding and Circumcision prior to discharge   LOS: 2 days   Daron Breeding G 07/08/2016, 8:26 AM

## 2016-07-08 NOTE — Lactation Note (Signed)
This note was copied from a baby's chart. Lactation Consultation Note: Initial visit with mom. Baby in nursery for circ. Mom reports he has been nursing a lot this morning. Reports he latches well but seems to slide to tip of nipple after a while. Encouraged to [facility] him and start again. Nipples intact. Encouraged EBM to nipples after nursing. Reviewed normal behavior after circ. BF brochure given. Reviewed our phone number, OP appointments and BFSG as resources for support after DC. Discussed engorgement prevention and treatment. No questions at present. Encouraged to page for assist when baby wakes for feeding.   Patient Name: Stacey Ryan [id number] Date: 07/08/2016 Reason for consult: Initial assessment   Maternal Data Formula Feeding for Exclusion: No Has patient been taught Hand Expression?: Yes Does the patient have breastfeeding experience prior to this delivery?: No  Feeding    LATCH Score/Interventions                      Lactation Tools Discussed/Used WIC Program: No   Consult Status Consult Status: Follow-up Date: 07/08/16 Follow-up type: In-patient    Pamelia Hoit 07/08/2016, 9:29 AM

## 2016-07-08 NOTE — Plan of Care (Signed)
Problem: Education: Goal: Knowledge of condition will improve Discharge education reviewed with patient and significant other. Patient verbalizes understanding.    

## 2016-07-08 NOTE — Lactation Note (Signed)
This note was copied from a baby's chart. Lactation Consultation Note: Mom paged for me to observe latch. I came at end of feeding. Mom easily able to hand express Colostrum. Latched again but few little sucking noted now. Mom reports no pain with latch and nipples look intact. Has Spectra pump for home. No questions at present. To call prn  Patient Name: Stacey Ryan [id number] Date: 07/08/2016 Reason for consult: Follow-up assessment   Maternal Data Formula Feeding for Exclusion: No Has patient been taught Hand Expression?: Yes Does the patient have breastfeeding experience prior to this delivery?: No  Feeding Feeding Type: Breast Fed Length of feed: 12 min  LATCH Score/Interventions Latch: Grasps breast easily, tongue down, lips flanged, rhythmical sucking.  Audible Swallowing: A few with stimulation  Type of Nipple: Everted at rest and after stimulation  Comfort (Breast/Nipple): Soft / non-tender     Hold (Positioning): No assistance needed to correctly position infant at breast. Intervention(s): Breastfeeding basics reviewed  LATCH Score: 9  Lactation Tools Discussed/Used WIC Program: No   Consult Status Consult Status: Complete Date: 07/08/16 Follow-up type: In-patient    Pamelia Hoit 07/08/2016, 1:00 PM

## 2018-01-31 LAB — OB RESULTS CONSOLE HIV ANTIBODY (ROUTINE TESTING): HIV: NONREACTIVE

## 2018-01-31 LAB — OB RESULTS CONSOLE HEPATITIS B SURFACE ANTIGEN: Hepatitis B Surface Ag: NEGATIVE

## 2018-01-31 LAB — OB RESULTS CONSOLE ANTIBODY SCREEN: Antibody Screen: NEGATIVE

## 2018-01-31 LAB — OB RESULTS CONSOLE RUBELLA ANTIBODY, IGM: Rubella: IMMUNE

## 2018-01-31 LAB — OB RESULTS CONSOLE ABO/RH: RH Type: POSITIVE

## 2018-01-31 LAB — OB RESULTS CONSOLE RPR: RPR: NONREACTIVE

## 2018-03-15 NOTE — L&D Delivery Note (Signed)
Delivery Note At 1:47 PM a viable female was delivered via Vaginal, Spontaneous (Presentation: ;vertex).  APGAR: 8, 9; weight pending.   Placenta status: WNL , .  Cord:  with the following complications: nuchal cord - reduced with delivery.       Anesthesia:  CLE Episiotomy: None Lacerations: 1st degree;Periurethral Suture Repair: 3.0 vicryl Est. Blood Loss (mL): 200   It's a girl - "Corabella" to join brother Tommi Rumps!!   Mom to postpartum.  Baby to Couplet care / Skin to Skin.  Colin Benton Blong Busk 09/05/2018, 2:24 PM

## 2018-03-16 LAB — OB RESULTS CONSOLE GC/CHLAMYDIA
Chlamydia: NEGATIVE
Gonorrhea: NEGATIVE

## 2018-06-26 LAB — OB RESULTS CONSOLE GBS: GBS: POSITIVE

## 2018-08-24 ENCOUNTER — Encounter (HOSPITAL_COMMUNITY): Payer: Self-pay | Admitting: *Deleted

## 2018-08-24 ENCOUNTER — Telehealth (HOSPITAL_COMMUNITY): Payer: Self-pay | Admitting: *Deleted

## 2018-08-24 NOTE — Telephone Encounter (Signed)
Preadmission screen  

## 2018-09-01 ENCOUNTER — Other Ambulatory Visit (HOSPITAL_COMMUNITY)
Admission: RE | Admit: 2018-09-01 | Discharge: 2018-09-01 | Disposition: A | Discharge status: Home / Self Care | Payer: BC Managed Care – PPO | Source: Ambulatory Visit | Attending: Obstetrics & Gynecology | Admitting: Obstetrics & Gynecology

## 2018-09-01 ENCOUNTER — Other Ambulatory Visit: Payer: Self-pay

## 2018-09-01 DIAGNOSIS — Z1159 Encounter for screening for other viral diseases: Secondary | ICD-10-CM | POA: Diagnosis present

## 2018-09-01 NOTE — MAU Note (Signed)
Asymptomatic, swab collected without problem. 

## 2018-09-02 LAB — SARS CORONAVIRUS 2 (TAT 6-24 HRS): SARS Coronavirus 2: NEGATIVE

## 2018-09-05 ENCOUNTER — Other Ambulatory Visit: Payer: Self-pay

## 2018-09-05 ENCOUNTER — Inpatient Hospital Stay (HOSPITAL_COMMUNITY)
Admission: AD | Admit: 2018-09-05 | Discharge: 2018-09-06 | DRG: 806 | Disposition: A | Discharge status: Home / Self Care | Payer: BC Managed Care – PPO | Attending: Obstetrics and Gynecology | Admitting: Obstetrics and Gynecology

## 2018-09-05 ENCOUNTER — Inpatient Hospital Stay (HOSPITAL_COMMUNITY): Payer: BC Managed Care – PPO | Admitting: Anesthesiology

## 2018-09-05 ENCOUNTER — Inpatient Hospital Stay (HOSPITAL_COMMUNITY): Payer: BC Managed Care – PPO

## 2018-09-05 ENCOUNTER — Encounter (HOSPITAL_COMMUNITY): Payer: Self-pay | Admitting: General Practice

## 2018-09-05 DIAGNOSIS — Z3490 Encounter for supervision of normal pregnancy, unspecified, unspecified trimester: Secondary | ICD-10-CM

## 2018-09-05 DIAGNOSIS — A6000 Herpesviral infection of urogenital system, unspecified: Secondary | ICD-10-CM | POA: Diagnosis present

## 2018-09-05 DIAGNOSIS — Z1159 Encounter for screening for other viral diseases: Secondary | ICD-10-CM | POA: Diagnosis not present

## 2018-09-05 DIAGNOSIS — O26893 Other specified pregnancy related conditions, third trimester: Secondary | ICD-10-CM | POA: Diagnosis present

## 2018-09-05 DIAGNOSIS — Z3A39 39 weeks gestation of pregnancy: Secondary | ICD-10-CM | POA: Diagnosis not present

## 2018-09-05 DIAGNOSIS — O99824 Streptococcus B carrier state complicating childbirth: Secondary | ICD-10-CM | POA: Diagnosis present

## 2018-09-05 DIAGNOSIS — Z23 Encounter for immunization: Secondary | ICD-10-CM

## 2018-09-05 DIAGNOSIS — O9832 Other infections with a predominantly sexual mode of transmission complicating childbirth: Secondary | ICD-10-CM | POA: Diagnosis present

## 2018-09-05 LAB — CBC
HCT: 33.1 % — ABNORMAL LOW (ref 36.0–46.0)
Hemoglobin: 10.9 g/dL — ABNORMAL LOW (ref 12.0–15.0)
MCH: 28.2 pg (ref 26.0–34.0)
MCHC: 32.9 g/dL (ref 30.0–36.0)
MCV: 85.5 fL (ref 80.0–100.0)
Platelets: 224 10*3/uL (ref 150–400)
RBC: 3.87 MIL/uL (ref 3.87–5.11)
RDW: 14.3 % (ref 11.5–15.5)
WBC: 7.7 10*3/uL (ref 4.0–10.5)
nRBC: 0 % (ref 0.0–0.2)

## 2018-09-05 LAB — ABO/RH: ABO/RH(D): A POS

## 2018-09-05 LAB — TYPE AND SCREEN
ABO/RH(D): A POS
Antibody Screen: NEGATIVE

## 2018-09-05 MED ORDER — BENZOCAINE-MENTHOL 20-0.5 % EX AERO: 1.0000 "application " | INHALATION_SPRAY | CUTANEOUS | Status: DC | PRN

## 2018-09-05 MED ORDER — ONDANSETRON HCL 4 MG PO TABS: 4.0000 mg | ORAL_TABLET | ORAL | Status: DC | PRN

## 2018-09-05 MED ORDER — ONDANSETRON HCL 4 MG/2ML IJ SOLN: 4.0000 mg | INTRAMUSCULAR | Status: DC | PRN

## 2018-09-05 MED ORDER — OXYCODONE HCL 5 MG PO TABS: 10.0000 mg | ORAL_TABLET | ORAL | Status: DC | PRN

## 2018-09-05 MED ORDER — EPHEDRINE 5 MG/ML INJ: 10.0000 mg | INTRAVENOUS | Status: DC | PRN

## 2018-09-05 MED ORDER — OXYTOCIN 40 UNITS IN NORMAL SALINE INFUSION - SIMPLE MED: 2.5000 [IU]/h | INTRAVENOUS | Status: DC

## 2018-09-05 MED ORDER — TERBUTALINE SULFATE 1 MG/ML IJ SOLN: 0.2500 mg | Freq: Once | INTRAMUSCULAR | Status: DC | PRN

## 2018-09-05 MED ORDER — IBUPROFEN 600 MG PO TABS
600.0000 mg | ORAL_TABLET | Freq: Four times a day (QID) | ORAL | Status: DC
  Administered 2018-09-05 – 2018-09-06 (×4): 600 mg via ORAL
  Filled 2018-09-05 (×4): qty 1

## 2018-09-05 MED ORDER — FENTANYL-BUPIVACAINE-NACL 0.5-0.125-0.9 MG/250ML-% EP SOLN
12.0000 mL/h | EPIDURAL | Status: DC | PRN
  Administered 2018-09-05: 12 mL/h via EPIDURAL
  Filled 2018-09-05: qty 250

## 2018-09-05 MED ORDER — PHENYLEPHRINE 40 MCG/ML (10ML) SYRINGE FOR IV PUSH (FOR BLOOD PRESSURE SUPPORT)
80.0000 ug | PREFILLED_SYRINGE | INTRAVENOUS | Status: DC | PRN
  Filled 2018-09-05: qty 10

## 2018-09-05 MED ORDER — LACTATED RINGERS IV SOLN
INTRAVENOUS | Status: DC
  Administered 2018-09-05 (×2): via INTRAVENOUS

## 2018-09-05 MED ORDER — ZOLPIDEM TARTRATE 5 MG PO TABS: 5.0000 mg | ORAL_TABLET | Freq: Every evening | ORAL | Status: DC | PRN

## 2018-09-05 MED ORDER — SIMETHICONE 80 MG PO CHEW: 80.0000 mg | CHEWABLE_TABLET | ORAL | Status: DC | PRN

## 2018-09-05 MED ORDER — LACTATED RINGERS IV SOLN: 500.0000 mL | INTRAVENOUS | Status: DC | PRN

## 2018-09-05 MED ORDER — PRENATAL MULTIVITAMIN CH
1.0000 | ORAL_TABLET | Freq: Every day | ORAL | Status: DC
  Administered 2018-09-06: 1 via ORAL
  Filled 2018-09-05: qty 1

## 2018-09-05 MED ORDER — PNEUMOCOCCAL VAC POLYVALENT 25 MCG/0.5ML IJ INJ
0.5000 mL | INJECTION | INTRAMUSCULAR | Status: AC
  Administered 2018-09-06: 0.5 mL via INTRAMUSCULAR
  Filled 2018-09-05: qty 0.5

## 2018-09-05 MED ORDER — LIDOCAINE HCL (PF) 1 % IJ SOLN: 30.0000 mL | INTRAMUSCULAR | Status: DC | PRN

## 2018-09-05 MED ORDER — SODIUM CHLORIDE 0.9 % IV SOLN
5.0000 10*6.[IU] | Freq: Once | INTRAVENOUS | Status: AC
  Administered 2018-09-05: 5 10*6.[IU] via INTRAVENOUS
  Filled 2018-09-05: qty 5

## 2018-09-05 MED ORDER — HYDROXYZINE HCL 50 MG PO TABS: 50.0000 mg | ORAL_TABLET | Freq: Four times a day (QID) | ORAL | Status: DC | PRN

## 2018-09-05 MED ORDER — OXYTOCIN 40 UNITS IN NORMAL SALINE INFUSION - SIMPLE MED
1.0000 m[IU]/min | INTRAVENOUS | Status: DC
  Administered 2018-09-05: 2 m[IU]/min via INTRAVENOUS
  Filled 2018-09-05: qty 1000

## 2018-09-05 MED ORDER — OXYCODONE-ACETAMINOPHEN 5-325 MG PO TABS: 2.0000 | ORAL_TABLET | ORAL | Status: DC | PRN

## 2018-09-05 MED ORDER — LIDOCAINE HCL (PF) 1 % IJ SOLN
INTRAMUSCULAR | Status: DC | PRN
  Administered 2018-09-05 (×2): 4 mL via EPIDURAL

## 2018-09-05 MED ORDER — DIPHENHYDRAMINE HCL 50 MG/ML IJ SOLN: 12.5000 mg | INTRAMUSCULAR | Status: DC | PRN

## 2018-09-05 MED ORDER — SODIUM CHLORIDE (PF) 0.9 % IJ SOLN
INTRAMUSCULAR | Status: DC | PRN
  Administered 2018-09-05: 12 mL/h via EPIDURAL

## 2018-09-05 MED ORDER — OXYTOCIN BOLUS FROM INFUSION
500.0000 mL | Freq: Once | INTRAVENOUS | Status: AC
  Administered 2018-09-05: 500 mL via INTRAVENOUS

## 2018-09-05 MED ORDER — ALBUTEROL SULFATE (2.5 MG/3ML) 0.083% IN NEBU: 2.5000 mg | INHALATION_SOLUTION | Freq: Four times a day (QID) | RESPIRATORY_TRACT | Status: DC | PRN

## 2018-09-05 MED ORDER — PENICILLIN G 3 MILLION UNITS IVPB - SIMPLE MED
3.0000 10*6.[IU] | INTRAVENOUS | Status: DC
  Administered 2018-09-05: 3 10*6.[IU] via INTRAVENOUS
  Filled 2018-09-05: qty 100

## 2018-09-05 MED ORDER — ACETAMINOPHEN 325 MG PO TABS: 650.0000 mg | ORAL_TABLET | ORAL | Status: DC | PRN

## 2018-09-05 MED ORDER — SOD CITRATE-CITRIC ACID 500-334 MG/5ML PO SOLN: 30.0000 mL | ORAL | Status: DC | PRN

## 2018-09-05 MED ORDER — OXYCODONE HCL 5 MG PO TABS: 5.0000 mg | ORAL_TABLET | ORAL | Status: DC | PRN

## 2018-09-05 MED ORDER — DIPHENHYDRAMINE HCL 25 MG PO CAPS: 25.0000 mg | ORAL_CAPSULE | Freq: Four times a day (QID) | ORAL | Status: DC | PRN

## 2018-09-05 MED ORDER — DIBUCAINE (PERIANAL) 1 % EX OINT: 1.0000 "application " | TOPICAL_OINTMENT | CUTANEOUS | Status: DC | PRN

## 2018-09-05 MED ORDER — COCONUT OIL OIL: 1.0000 "application " | TOPICAL_OIL | Status: DC | PRN

## 2018-09-05 MED ORDER — SENNOSIDES-DOCUSATE SODIUM 8.6-50 MG PO TABS
2.0000 | ORAL_TABLET | ORAL | Status: DC
  Administered 2018-09-06: 2 via ORAL
  Filled 2018-09-05: qty 2

## 2018-09-05 MED ORDER — BUTORPHANOL TARTRATE 1 MG/ML IJ SOLN: 1.0000 mg | INTRAMUSCULAR | Status: DC | PRN

## 2018-09-05 MED ORDER — ONDANSETRON HCL 4 MG/2ML IJ SOLN: 4.0000 mg | Freq: Four times a day (QID) | INTRAMUSCULAR | Status: DC | PRN

## 2018-09-05 MED ORDER — WITCH HAZEL-GLYCERIN EX PADS: 1.0000 "application " | MEDICATED_PAD | CUTANEOUS | Status: DC | PRN

## 2018-09-05 MED ORDER — LACTATED RINGERS IV SOLN: 500.0000 mL | Freq: Once | INTRAVENOUS | Status: DC

## 2018-09-05 MED ORDER — OXYCODONE-ACETAMINOPHEN 5-325 MG PO TABS: 1.0000 | ORAL_TABLET | ORAL | Status: DC | PRN

## 2018-09-05 MED ORDER — TETANUS-DIPHTH-ACELL PERTUSSIS 5-2.5-18.5 LF-MCG/0.5 IM SUSP: 0.5000 mL | Freq: Once | INTRAMUSCULAR | Status: DC

## 2018-09-05 NOTE — Lactation Note (Addendum)
This note was copied from a baby's chart. 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 Infant had 2 stools and one void since delivery. Mom changed  one void and stool diaper while LC was in the room. Per mom, she does have an DEBP at home. LC did not observe latch at this time, per mom , she finished breast feeding infant prior to [facility] entering the room and infant breastfeed for 30 minutes.  Mom is still breastfeeding her two year old toddler she plans to "tandem feed" her children. LC discussed breastfeeding infant first and allow infant to feeding then toward the  end of infant feeding, have  her two year old latch at this time. LC suggest football hold with infant and allow older toddler to stand while breast feeding.  Discussed higher calorie diet 700- 800+ extra calories in diet  due to  breastfeeding two  children at different age stages.  Mom knows to breastfeed according hunger cues, 8 to 12 times within 24 hours and on demand. LC discussed I & O. Reviewed Baby & Me book's Breastfeeding Basics.  Mom knows to call Nurse or Ovid if she has any questions, concerns or need assistance with latching infant to breast.  Mom made aware of O/P services, breastfeeding support groups, community resources, and our phone # for post-discharge questions.   Maternal Data Formula Feeding for Exclusion: No Has patient been taught Hand Expression?: Yes Does the patient have breastfeeding experience prior to this delivery?: Yes  Feeding    LATCH Score                   Interventions Interventions: Breast feeding basics reviewed;Skin to skin;Position options  Lactation Tools Discussed/Used WIC Program: No   Consult Status Consult Status: Follow-up Date: 09/06/18 Follow-up type: In-patient    Vicente Serene 09/05/2018, 9:17 PM

## 2018-09-05 NOTE — Anesthesia Procedure Notes (Signed)
Epidural Patient location during procedure: OB Start time: 09/05/2018 10:32 AM End time: 09/05/2018 10:46 AM  Staffing Anesthesiologist: Nolon Nations, MD Performed: anesthesiologist   Preanesthetic Checklist Completed: patient identified, pre-op evaluation, timeout performed, IV checked, risks and benefits discussed and monitors and equipment checked  Epidural Patient position: sitting Prep: site prepped and draped and DuraPrep Patient monitoring: heart rate, continuous pulse ox and blood pressure Approach: midline Location: L2-L3 Injection technique: LOR air and LOR saline  Needle:  Needle type: Tuohy  Needle gauge: 17 G Needle length: 9 cm Needle insertion depth: 6 cm Catheter type: closed end flexible Catheter size: 19 Gauge Catheter at skin depth: 11 cm Test dose: negative  Assessment Sensory level: T8 Events: blood not aspirated, injection not painful, no injection resistance, negative IV test and no paresthesia  Additional Notes Reason for block:procedure for pain

## 2018-09-05 NOTE — H&P (Signed)
Stacey Ryan is a 28 y.o. female presenting for IOL w/favorable cervix. 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 Patient anxious about PPD. Expecting girl - "Corabella" OB History    Gravida  2   Para  1   Term  1   Preterm      AB      Living  1     SAB      TAB      Ectopic      Multiple  0   Live Births  1          Past Medical History:  Diagnosis Date  . Asthma   . Medical history non-contributory    Past Surgical History:  Procedure Laterality Date  . NO PAST SURGERIES    . WISDOM TOOTH EXTRACTION     Family History: family history includes Crohn's disease in her maternal grandmother; Diabetes in her maternal grandmother; Early death in her mother; Hypertension in her father; Kidney disease in her maternal grandmother. Social History:  reports that she has never smoked. She has never used smokeless tobacco. She reports that she does not drink alcohol or use drugs.     Maternal Diabetes: No Genetic Screening: Normal Maternal Ultrasounds/Referrals: Normal Fetal Ultrasounds or other Referrals:  None Maternal Substance Abuse:  No Significant Maternal Medications:  None Significant Maternal Lab Results:  None Other Comments:  None  ROS History Dilation: 5 Effacement (%): 50 Station: -2 Exam by:: Dr. Royston Sinner Blood pressure 116/76, pulse 90, temperature 98.2 F (36.8 C), temperature source Oral, resp. rate 18, height 5\' 7"  (1.702 m), weight 95.3 kg, last menstrual period 12/06/2017, SpO2 99 %, unknown if currently breastfeeding. Exam Physical Exam NAD, A&O NWOB Abd soft, nondistended, gravid SVE 5/50/-2, AROM for clear fluid  Prenatal labs: ABO, Rh: --/--/PENDING (06/23 7253) Antibody: PENDING (06/23 6644) Rubella: Immune (11/19 0000) RPR: Nonreactive (11/19 0000)  HBsAg: Negative (11/19 0000)  HIV: Non-reactive (11/19 0000)  GBS: Positive (04/13 0000)   Assessment/Plan: 28 yo G2P1001 @ 39.0 wga presenting for IOL with  favorable cervix.  Pitocin. AROM for clear fluid. No HSV outbreaks on exam. SW consult PPD. GBS pos - s/p PCN x 1 ~ 1 hour prior to AROM.   Colin Benton Jasyah Theurer 09/05/2018, 11:06 AM

## 2018-09-05 NOTE — Anesthesia Preprocedure Evaluation (Signed)
Anesthesia Evaluation  Patient identified by MRN, date of birth, ID band Patient awake    Reviewed: Allergy & Precautions, Patient's Chart, lab work & pertinent test results  Airway Mallampati: II  TM Distance: >3 FB Neck ROM: Full    Dental   Pulmonary asthma ,    Pulmonary exam normal breath sounds clear to auscultation       Cardiovascular negative cardio ROS Normal cardiovascular exam Rhythm:Regular Rate:Normal     Neuro/Psych negative neurological ROS  negative psych ROS   GI/Hepatic negative GI ROS, Neg liver ROS,   Endo/Other  negative endocrine ROS  Renal/GU negative Renal ROS  negative genitourinary   Musculoskeletal negative musculoskeletal ROS (+)   Abdominal   Peds negative pediatric ROS (+)  Hematology negative hematology ROS (+)   Anesthesia Other Findings   Reproductive/Obstetrics (+) Pregnancy                             Anesthesia Physical  Anesthesia Plan  ASA: II  Anesthesia Plan: Epidural   Post-op Pain Management:    Induction:   PONV Risk Score and Plan:   Airway Management Planned:   Additional Equipment:   Intra-op Plan:   Post-operative Plan:   Informed Consent: I have reviewed the patients History and Physical, chart, labs and discussed the procedure including the risks, benefits and alternatives for the proposed anesthesia with the patient or authorized representative who has indicated his/her understanding and acceptance.       Plan Discussed with:   Anesthesia Plan Comments:         Anesthesia Quick Evaluation

## 2018-09-06 ENCOUNTER — Encounter (HOSPITAL_COMMUNITY): Payer: Self-pay | Admitting: *Deleted

## 2018-09-06 LAB — CBC
HCT: 31.3 % — ABNORMAL LOW (ref 36.0–46.0)
Hemoglobin: 10.1 g/dL — ABNORMAL LOW (ref 12.0–15.0)
MCH: 27.7 pg (ref 26.0–34.0)
MCHC: 32.3 g/dL (ref 30.0–36.0)
MCV: 85.8 fL (ref 80.0–100.0)
Platelets: 204 10*3/uL (ref 150–400)
RBC: 3.65 MIL/uL — ABNORMAL LOW (ref 3.87–5.11)
RDW: 14.3 % (ref 11.5–15.5)
WBC: 9 10*3/uL (ref 4.0–10.5)
nRBC: 0 % (ref 0.0–0.2)

## 2018-09-06 MED ORDER — ACETAMINOPHEN 325 MG PO TABS: 650.0000 mg | ORAL_TABLET | Freq: Four times a day (QID) | ORAL | 1 refills | Status: AC | PRN

## 2018-09-06 NOTE — Anesthesia Postprocedure Evaluation (Signed)
Anesthesia Post Note  Patient: Stacey Ryan  Procedure(s) Performed: AN AD [place]     Patient location during evaluation: Mother Baby Anesthesia Type: Epidural Level of consciousness: awake and alert Pain management: pain level controlled Vital Signs Assessment: post-procedure vital signs reviewed and stable Respiratory status: spontaneous breathing, nonlabored ventilation and respiratory function stable Cardiovascular status: stable Postop Assessment: no headache, no backache, epidural receding, no apparent nausea or vomiting, patient able to bend at knees, adequate PO intake and able to ambulate Anesthetic complications: no    Last Vitals:  Vitals:   09/06/18 0120 09/06/18 0504  BP: 116/82 114/73  Pulse: 60 62  Resp: 18 16  Temp: 36.7 C 36.7 C  SpO2: 99% 99%    Last Pain:  Vitals:   09/06/18 0504  TempSrc: Oral  PainSc:    Pain Goal:                   Jabier Mutton

## 2018-09-06 NOTE — Discharge Summary (Signed)
Obstetric Discharge Summary Reason for Admission: induction of labor Prenatal Procedures: none Intrapartum Procedures: spontaneous vaginal delivery Postpartum Procedures: none Complications-Operative and Postpartum: none Hemoglobin  Date Value Ref Range Status  09/06/2018 10.1 (L) 12.0 - 15.0 g/dL Final   HCT  Date Value Ref Range Status  09/06/2018 31.3 (L) 36.0 - 46.0 % Final    Physical Exam:  General: alert, cooperative and no distress Lochia: appropriate Uterine Fundus: firm Incision: healing well DVT Evaluation: No evidence of DVT seen on physical exam.  Discharge Diagnoses: Term Pregnancy-delivered  Discharge Information: Date: 09/06/2018 Activity: pelvic rest Diet: routine Medications: PNV and Ibuprofen Condition: stable Instructions: refer to practice specific booklet Discharge to: home   Newborn Data: Live born female  Birth Weight: 7 lb 6.3 oz (3355 g) APGAR: 8, 9  Newborn Delivery   Birth date/time: 09/05/2018 13:47:00 Delivery type: Vaginal, Spontaneous      Home with mother.  Shon Millet II 09/06/2018, 10:54 AM

## 2018-09-06 NOTE — Progress Notes (Signed)
CSW received consult for hx of Anxiety and Depression.  CSW met with [name] to offer support and complete assessment.    CSW congratulated [name] on the birth of infant upon entering the room. CSW explained to [name] the reason for the visit. 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 [name] reports that she was depressed and cried a lot. [name] expressed that this lasted 6 months for and then she was fine. [name] expressed that she has been feeling fine since giving birth and denies having SI or HI. [name] expressed that she is not on any medications but she does see a therapist as needed. [name] reports that she plans to follow up with therapist as needed from here out.   [name] expressed that her supports are her spouse at this time. [name] reports that she has all needed items to care for infant with no other concerns.   CSW provided education regarding the baby blues period vs. perinatal mood disorders, discussed treatment and gave resources for mental health follow up if concerns arise.  CSW recommends self-evaluation during the postpartum time period using the New Mom Checklist from Postpartum Progress and encouraged [name] to contact a medical professional if symptoms are noted at any time.   CSW provided review of Sudden Infant Death Syndrome (SIDS) precautions.   CSW identifies no further need for intervention and no barriers to discharge at this time.     Stacey Ryan S. Stacey Ryan, MSW, LCSW-[facility] [place] [phone]  

## 2018-09-07 LAB — RPR: RPR Ser Ql: NONREACTIVE

## 2024-01-05 ENCOUNTER — Encounter: Payer: Self-pay | Admitting: Family Medicine

## 2024-01-05 ENCOUNTER — Ambulatory Visit
Admission: EM | Admit: 2024-01-05 | Discharge: 2024-01-05 | Disposition: A | Discharge status: Home / Self Care | Attending: Family Medicine | Admitting: Family Medicine

## 2024-01-05 DIAGNOSIS — N3000 Acute cystitis without hematuria: Secondary | ICD-10-CM | POA: Diagnosis not present

## 2024-01-05 LAB — POCT URINE DIPSTICK
Bilirubin, UA: NEGATIVE
Blood, UA: NEGATIVE
Glucose, UA: NEGATIVE mg/dL
Ketones, POC UA: NEGATIVE mg/dL
Nitrite, UA: POSITIVE — AB
POC PROTEIN,UA: NEGATIVE
Spec Grav, UA: 1.015 (ref 1.010–1.025)
Urobilinogen, UA: 0.2 U/dL
pH, UA: 7 (ref 5.0–8.0)

## 2024-01-05 MED ORDER — CEFDINIR 300 MG PO CAPS: 300.0000 mg | ORAL_CAPSULE | Freq: Two times a day (BID) | ORAL | 0 refills | Status: AC

## 2024-01-05 NOTE — ED Provider Notes (Signed)
 [name]    CSN: [id number] Arrival date & time: 01/05/24  0906      History   Chief Complaint Chief Complaint  Patient presents with   Back Pain   Hematuria    HPI Stacey Ryan is a 33 y.o. female.   HPI 33 year old female presents with hematuria and back pain for 1 week.  Reports positive home urine test yesterday and is currently taking AZO.  Patient reports currently [redacted] weeks pregnant with EDD of 01/18/2024.  PMH significant for IBS.  Past Medical History:  Diagnosis Date   Asthma    Medical history non-contributory     Patient Active Problem List   Diagnosis Date Noted   Pregnant 09/05/2018   Pregnancy 07/06/2016   ABNORMAL TRANSAMINASE-LFT'S 02/02/2008   IBS 12/22/2007    Past Surgical History:  Procedure Laterality Date   NO PAST SURGERIES     WISDOM TOOTH EXTRACTION      OB History     Gravida  3   Para  2   Term  2   Preterm      AB      Living  2      SAB      IAB      Ectopic      Multiple  0   Live Births  2            Home Medications    Prior to Admission medications   Medication Sig Start Date End Date Taking? Authorizing Provider  cefdinir (OMNICEF) 300 MG capsule Take 1 capsule (300 mg total) by mouth 2 (two) times daily for 7 days. 01/05/24 01/12/24 Yes Teddy Sharper, FNP  acetaminophen  (TYLENOL ) 325 MG tablet Take 2 tablets (650 mg total) by mouth every 6 (six) hours as needed (for pain scale < 4). 09/06/18   Curlene Agent, MD  albuterol  (PROVENTIL  HFA;VENTOLIN  HFA) 108 (90 Base) MCG/ACT inhaler Inhale 2 puffs into the lungs every 6 (six) hours as needed.     [provider]  ibuprofen  (ADVIL ,MOTRIN ) 600 MG tablet Take 1 tablet (600 mg total) by mouth every 6 (six) hours. 07/08/16   Tod Ivanoff, NP  Prenatal Vit-Fe Fumarate-FA (PRENATAL MULTIVITAMIN) TABS tablet Take 1 tablet by mouth daily at 12 noon.    [provider]    Family History Family History  Problem Relation Age  of Onset   Early death Mother        MVA   Hypertension Father    Kidney disease Maternal Grandmother    Diabetes Maternal Grandmother    Crohn's disease Maternal Grandmother     Social History Social History   Tobacco Use   Smoking status: Never   Smokeless tobacco: Never  Vaping Use   Vaping status: Never Used  Substance Use Topics   Alcohol use: No   Drug use: No     Allergies   Patient has no known allergies.   Review of Systems Review of Systems  Genitourinary:  Positive for hematuria.  Musculoskeletal:  Positive for back pain.  All other systems reviewed and are negative.    Physical Exam Triage Vital Signs ED Triage Vitals  Encounter Vitals Group     BP      Girls Systolic BP Percentile      Girls Diastolic BP Percentile      Boys Systolic BP Percentile      Boys Diastolic BP Percentile      Pulse  Resp      Temp      Temp src      SpO2      Weight      Height      Head Circumference      Peak Flow      Pain Score      Pain Loc      Pain Education      Exclude from Growth Chart    No data found.  Updated Vital Signs BP 131/81 (BP Location: Right Arm)   Pulse 81   Temp 98 F (36.7 C) (Oral)   Resp 17   SpO2 96%   Visual Acuity Right Eye Distance:   Left Eye Distance:   Bilateral Distance:    Right Eye Near:   Left Eye Near:    Bilateral Near:     Physical Exam Vitals and nursing note reviewed.  Constitutional:      General: She is not in acute distress.    Appearance: Normal appearance. She is normal weight. She is not ill-appearing.  HENT:     Head: Normocephalic and atraumatic.     Mouth/Throat:     Mouth: Mucous membranes are moist.     Pharynx: Oropharynx is clear.  Eyes:     Extraocular Movements: Extraocular movements intact.     Pupils: Pupils are equal, round, and reactive to light.  Cardiovascular:     Rate and Rhythm: Normal rate and regular rhythm.     Pulses: Normal pulses.     Heart sounds: Normal  heart sounds.  Pulmonary:     Effort: Pulmonary effort is normal.     Breath sounds: No wheezing, rhonchi or rales.  Abdominal:     Tenderness: There is no right CVA tenderness or left CVA tenderness.  Musculoskeletal:        General: Normal range of motion.  Skin:    General: Skin is warm and dry.  Neurological:     General: No focal deficit present.     Mental Status: She is alert and oriented to person, place, and time.  Psychiatric:        Mood and Affect: Mood normal.        Behavior: Behavior normal.      UC Treatments / Results  Labs (all labs ordered are listed, but only abnormal results are displayed) Labs Reviewed  POCT URINE DIPSTICK - Abnormal; Notable for the following components:      Result Value   Clarity, UA cloudy (*)    Nitrite, UA Positive (*)    Leukocytes, UA Small (1+) (*)    All other components within normal limits  URINE CULTURE    EKG   Radiology No results found.  Procedures Procedures (including critical care time)  Medications Ordered in UC Medications - No data to display  Initial Impression / Assessment and Plan / UC Course  I have reviewed the triage vital signs and the nursing notes.  Pertinent labs & imaging results that were available during my care of the patient were reviewed by me and considered in my medical decision making (see chart for details).     MDM: 1.  Acute cystitis without hematuria-UA revealed above, urine culture ordered, Rx'd cefdinir 300 mg capsule: Take 1 capsule twice daily x 7 days. Advised patient to take medication as directed with food to completion.  Encouraged to increase daily water intake to 64 ounces per day while taking this medication.  Advised we will  follow-up with urine culture results once received.  Advised if symptoms worsen and are unresolved please follow-up with your PCP, OB, or here for further evaluation. Patient discharged home, hemodynamically stable.  Final Clinical Impressions(s) /  UC Diagnoses   Final diagnoses:  Acute cystitis without hematuria     Discharge Instructions      Advised patient to take medication as directed with food to completion.  Encouraged to increase daily water intake to 64 ounces per day while taking this medication.  Advised we will follow-up with urine culture results once received.  Advised if symptoms worsen and are unresolved please follow-up with your PCP, OB, or here for further evaluation.     ED Prescriptions     Medication Sig Dispense Auth. Provider   cefdinir (OMNICEF) 300 MG capsule Take 1 capsule (300 mg total) by mouth 2 (two) times daily for 7 days. 14 capsule Ronette Hank, FNP      PDMP not reviewed this encounter.   Teddy Sharper, FNP 01/05/24 [phone]

## 2024-01-05 NOTE — Discharge Instructions (Addendum)
 Advised patient to take medication as directed with food to completion.  Encouraged to increase daily water intake to 64 ounces per day while taking this medication.  Advised we will follow-up with urine culture results once received.  Advised if symptoms worsen and are unresolved please follow-up with your PCP, OB, or here for further evaluation.

## 2024-01-05 NOTE — ED Triage Notes (Addendum)
 Pt c/o hematuria and back pain x 1 week. Pos at home urine test yesterday. AZO prn. Pt currently [redacted] wks pregnant, EDD 01/18/24.

## 2024-01-06 ENCOUNTER — Telehealth: Payer: Self-pay

## 2024-01-06 LAB — URINE CULTURE

## 2024-01-06 NOTE — Telephone Encounter (Signed)
 Called to check on patient. No needs. Got medication.

## 2024-01-09 ENCOUNTER — Ambulatory Visit (HOSPITAL_COMMUNITY): Payer: Self-pay

## 2024-01-11 NOTE — Telephone Encounter (Signed)
 Pt returned call. Reports improvement in symptoms. Advised of results. Will f/u as needed.

## 2024-01-20 ENCOUNTER — Ambulatory Visit
Admission: EM | Admit: 2024-01-20 | Discharge: 2024-01-20 | Disposition: A | Discharge status: Home / Self Care | Attending: Internal Medicine | Admitting: Internal Medicine

## 2024-01-20 DIAGNOSIS — R3915 Urgency of urination: Secondary | ICD-10-CM | POA: Insufficient documentation

## 2024-01-20 DIAGNOSIS — R1024 Suprapubic pain: Secondary | ICD-10-CM | POA: Insufficient documentation

## 2024-01-20 LAB — POCT URINE DIPSTICK
Bilirubin, UA: NEGATIVE
Glucose, UA: NEGATIVE mg/dL
Ketones, POC UA: NEGATIVE mg/dL
Nitrite, UA: NEGATIVE
POC PROTEIN,UA: NEGATIVE
Spec Grav, UA: <=1.005 — AB (ref 1.010–1.025)
Urobilinogen, UA: 0.2 U/dL
pH, UA: 5.5 (ref 5.0–8.0)

## 2024-01-20 MED ORDER — CEPHALEXIN 500 MG PO CAPS: 500.0000 mg | ORAL_CAPSULE | Freq: Two times a day (BID) | ORAL | 0 refills | Status: AC

## 2024-01-20 NOTE — Discharge Instructions (Signed)
 I have prescribed an antibiotic to treat UTI. Urine culture is pending. We will call when it results. Please follow up with PCP and OBGYN.

## 2024-01-20 NOTE — ED Triage Notes (Signed)
 Pt c/o sxs of UTI since mid Oct. Was seen in UC on 10/23. Tx with abx. Sxs returned 4 days ago. Pt is 2 wks post partum.

## 2024-01-20 NOTE — ED Provider Notes (Signed)
 [facility]    CSN: [id number] Arrival date & time: 01/20/24  1336      History   Chief Complaint Chief Complaint  Patient presents with   Back Pain   Nausea   Cystitis    HPI Stacey Ryan is a 33 y.o. female.   Patient with concern of UTI given she has been having some bladder pain upon urinating.  These have been intermittent symptoms since mid October per patient report.  She was seen by her OB/GYN a few weeks prior, and she was instructed that she did not have a UTI but was treated for a vaginal yeast infection.  She was then seen at this urgent care on 10/23 where she was treated with cefdinir.  Urine culture was completed that showed multiple species.  We attempted to contact her to get recollect but she did not return to urgent care.  She reports that she gave birth 2 days after urgent care visit.  Symptoms seemed to worsen over the past 4 days.  She does have bilateral lower back pain as well.  Denies fever.  Denies dysuria.  She does have postpartum vaginal bleeding so is not sure if she has had any hematuria.  Denies any new vaginal discharge or concern for STD.  Denies history of recurrent UTI.   Back Pain   Past Medical History:  Diagnosis Date   Asthma    Medical history non-contributory     Patient Active Problem List   Diagnosis Date Noted   Pregnant 09/05/2018   Pregnancy 07/06/2016   ABNORMAL TRANSAMINASE-LFT'S 02/02/2008   IBS 12/22/2007    Past Surgical History:  Procedure Laterality Date   NO PAST SURGERIES     WISDOM TOOTH EXTRACTION      OB History     Gravida  3   Para  2   Term  2   Preterm      AB      Living  2      SAB      IAB      Ectopic      Multiple  0   Live Births  2            Home Medications    Prior to Admission medications   Medication Sig Start Date End Date Taking? Authorizing Provider  cephALEXin (KEFLEX) 500 MG capsule Take 1 capsule (500 mg total) by mouth 2 (two) times  daily for 7 days. 01/20/24 01/27/24 Yes Tkeyah Burkman, Darryle BRAVO, FNP  acetaminophen  (TYLENOL ) 325 MG tablet Take 2 tablets (650 mg total) by mouth every 6 (six) hours as needed (for pain scale < 4). 09/06/18   Curlene Agent, MD  albuterol  (PROVENTIL  HFA;VENTOLIN  HFA) 108 (90 Base) MCG/ACT inhaler Inhale 2 puffs into the lungs every 6 (six) hours as needed.     [provider]  ibuprofen  (ADVIL ,MOTRIN ) 600 MG tablet Take 1 tablet (600 mg total) by mouth every 6 (six) hours. 07/08/16   Tod Ivanoff, NP  Prenatal Vit-Fe Fumarate-FA (PRENATAL MULTIVITAMIN) TABS tablet Take 1 tablet by mouth daily at 12 noon.    [provider]    Family History Family History  Problem Relation Age of Onset   Early death Mother        MVA   Hypertension Father    Kidney disease Maternal Grandmother    Diabetes Maternal Grandmother    Crohn's disease Maternal Grandmother     Social History Social History   Tobacco  Use   Smoking status: Never   Smokeless tobacco: Never  Vaping Use   Vaping status: Never Used  Substance Use Topics   Alcohol use: No   Drug use: No     Allergies   Patient has no known allergies.   Review of Systems Review of Systems Per HPI  Physical Exam Triage Vital Signs ED Triage Vitals  Encounter Vitals Group     BP 01/20/24 1346 112/75     Girls Systolic BP Percentile --      Girls Diastolic BP Percentile --      Boys Systolic BP Percentile --      Boys Diastolic BP Percentile --      Pulse Rate 01/20/24 1346 92     Resp 01/20/24 1346 17     Temp 01/20/24 1346 97.9 F (36.6 C)     Temp Source 01/20/24 1346 Oral     SpO2 01/20/24 1346 96 %     Weight --      Height --      Head Circumference --      Peak Flow --      Pain Score 01/20/24 1347 8     Pain Loc --      Pain Education --      Exclude from Growth Chart --    No data found.  Updated Vital Signs BP 112/75 (BP Location: Right Arm)   Pulse 92   Temp 97.9 F (36.6 C) (Oral)   Resp 17    SpO2 96%   Breastfeeding Yes   Visual Acuity Right Eye Distance:   Left Eye Distance:   Bilateral Distance:    Right Eye Near:   Left Eye Near:    Bilateral Near:     Physical Exam Constitutional:      General: She is not in acute distress.    Appearance: Normal appearance. She is not toxic-appearing or diaphoretic.  HENT:     Head: Normocephalic and atraumatic.  Eyes:     Extraocular Movements: Extraocular movements intact.     Conjunctiva/sclera: Conjunctivae normal.  Pulmonary:     Effort: Pulmonary effort is normal.  Abdominal:     General: Bowel sounds are normal. There is no distension.     Palpations: Abdomen is soft.     Tenderness: There is no abdominal tenderness.  Musculoskeletal:     Comments: No tenderness to palpation to lower back.  No swelling or discoloration noted.  Neurological:     General: No focal deficit present.     Mental Status: She is alert and oriented to person, place, and time. Mental status is at baseline.  Psychiatric:        Mood and Affect: Mood normal.        Behavior: Behavior normal.        Thought Content: Thought content normal.        Judgment: Judgment normal.      UC Treatments / Results  Labs (all labs ordered are listed, but only abnormal results are displayed) Labs Reviewed  POCT URINE DIPSTICK - Abnormal; Notable for the following components:      Result Value   Clarity, UA cloudy (*)    Spec Grav, UA <=1.005 (*)    Blood, UA large (*)    Leukocytes, UA Large (3+) (*)    All other components within normal limits  URINE CULTURE    EKG   Radiology No results found.  Procedures Procedures (including critical care  time)  Medications Ordered in UC Medications - No data to display  Initial Impression / Assessment and Plan / UC Course  I have reviewed the triage vital signs and the nursing notes.  Pertinent labs & imaging results that were available during my care of the patient were reviewed by me and  considered in my medical decision making (see chart for details).     UA shows a large amount of leukocytes.  With associated symptoms, this is concerning for urinary tract infection.  Although, I did discuss with patient that symptoms could be related to recent pregnancy, delivery, postpartum.  Will send urine culture to confirm.  Patient reports increase in symptoms, so will opt to treat with cephalexin today.  Cephalexin was chosen given patient is currently breast-feeding and she is not able to bottle or formula feed at this time and other antibiotic options to treat UTI would not be safe.  Will change/add antibiotic if necessary once urine culture has resulted.  Advised adequate fluid intake.  I also highly recommended OB/GYN follow-up as well.  Patient verbalized understanding and was agreeable with plan.  Discussed clinical course with colleague MD. Final Clinical Impressions(s) / UC Diagnoses   Final diagnoses:  Urinary urgency  Suprapubic pain     Discharge Instructions      I have prescribed an antibiotic to treat UTI. Urine culture is pending. We will call when it results. Please follow up with PCP and OBGYN.     ED Prescriptions     Medication Sig Dispense Auth. Provider   cephALEXin (KEFLEX) 500 MG capsule Take 1 capsule (500 mg total) by mouth 2 (two) times daily for 7 days. 14 capsule [place], Ysela Hettinger E, [place]      PDMP not reviewed this encounter.   Hazen Darryle BRAVO, [place] 01/20/24 [phone]

## 2024-01-22 LAB — URINE CULTURE: Culture: 70000 — AB

## 2024-01-23 ENCOUNTER — Ambulatory Visit: Payer: Self-pay | Admitting: Internal Medicine
